# Patient Record
Sex: Female | Born: 2000
Health system: Southern US, Community
[De-identification: ages and names within clinical notes are randomized; demographics above are authoritative.]

## PROBLEM LIST (undated history)

## (undated) DIAGNOSIS — F329 Major depressive disorder, single episode, unspecified: Secondary | ICD-10-CM

## (undated) DIAGNOSIS — N390 Urinary tract infection, site not specified: Secondary | ICD-10-CM

## (undated) DIAGNOSIS — I499 Cardiac arrhythmia, unspecified: Secondary | ICD-10-CM

## (undated) DIAGNOSIS — R7303 Prediabetes: Secondary | ICD-10-CM

## (undated) DIAGNOSIS — F32A Depression, unspecified: Secondary | ICD-10-CM

## (undated) DIAGNOSIS — F419 Anxiety disorder, unspecified: Secondary | ICD-10-CM

## (undated) DIAGNOSIS — F909 Attention-deficit hyperactivity disorder, unspecified type: Secondary | ICD-10-CM

---
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
## 2020-02-01 ENCOUNTER — Ambulatory Visit (INDEPENDENT_AMBULATORY_CARE_PROVIDER_SITE_OTHER): Payer: 59 | Admitting: "Endocrinology

## 2020-02-01 ENCOUNTER — Encounter (INDEPENDENT_AMBULATORY_CARE_PROVIDER_SITE_OTHER): Payer: Self-pay | Admitting: "Endocrinology

## 2020-02-01 ENCOUNTER — Other Ambulatory Visit: Payer: Self-pay

## 2020-02-01 VITALS — BP 118/76 | HR 88 | Wt 192.6 lb

## 2020-02-01 DIAGNOSIS — Z6830 Body mass index (BMI) 30.0-30.9, adult: Secondary | ICD-10-CM

## 2020-02-01 DIAGNOSIS — F32A Depression, unspecified: Secondary | ICD-10-CM

## 2020-02-01 DIAGNOSIS — F419 Anxiety disorder, unspecified: Secondary | ICD-10-CM | POA: Diagnosis not present

## 2020-02-01 DIAGNOSIS — E049 Nontoxic goiter, unspecified: Secondary | ICD-10-CM | POA: Diagnosis not present

## 2020-02-01 DIAGNOSIS — E6609 Other obesity due to excess calories: Secondary | ICD-10-CM

## 2020-02-01 DIAGNOSIS — E063 Autoimmune thyroiditis: Secondary | ICD-10-CM

## 2020-02-01 NOTE — Patient Instructions (Signed)
Follow up visit in 4 months. Please call your psychiatrist today.

## 2020-02-01 NOTE — Progress Notes (Addendum)
Subjective:  Subjective  Patient Name: Dana Thompson Date of Birth: [birth year]  MRN: [id number]  Miki Roma Kayser) Dana Thompson  presents to the office today for follow up evaluation and management of her pre-diabetes and goiter.  HISTORY OF PRESENT ILLNESS:   Dana Thompson is a 19 y.o. Caucasian young lady.   Yelena was unaccompanied.  1. Layloni's initial pediatric endocrine evaluation occurred on 05/19/14.   A. Perinatal history: Gestational Age: [redacted]w[redacted]d; 5 lb 7 oz (2.466 kg); Healthy newborn, but had meconium staining so she was on antibiotics for 7 days.   B. Infancy: Healthy  C. Childhood: ADHD, for which she took Concerta; anxiety attacks for which she took fluoxetine; several prior strep infections, and possible chicken pox; No surgeries; No allergies to medications; She had some seasonal allergy symptoms and signs each Spring.  D. Chief complaint:   1).  Mom and [name] had discussed diabetes in the past when mom was in nursing school. [name] requested that a HbA1c value be drawn in August 2015. Mom reported that the value was 5.7%, c/w pre-diabetes. Follow up non-fasting lab tests on 12/06/13 showed a HbA1c of 5.9%, again c/w prediabetes, glucose of 85, cholesterol 145, triglycerides 54, HDL 55, and LDL 79.   2). [name] had been at camp for one month prior to the August tests and had been very active, After camp, however, she was fairly sedentary. After the September tests Dana Thompson was referred to Dr. [name] [name], RD, PhD, at the [facility] for nutritional counseling. Lifestyle changes had included reducing her intake of carbs and increasing her physical activity with volleyball and basketball.   E. Pertinent family history:   1). Obesity: Dad was overweight/obese. Mom gained about 40 pounds in the past year. Two paternal uncles were overweight.    2). DM: Maternal great grandmother developed DM at the end of her life.    3). Thyroid disease: None   4). ASCVD: Maternal grandfather had some form of heart disease.  Maternal grandfather also had a stroke due to fungal meningitis acquired in [place].    5). Cancers:  Paternal grandfather had throat CA. Mom's second cousin had brain cancer. Paternal grandmother had breast CA. Paternal grandaunt died of leukemia.   6). Others: Mom was recently diagnosed with fibromyalgia. Other members of her family had fibromyalgia.   F. Lifestyle:   1). Family diet: When Ashna was with mom they ate at restaurants about once a month. When she was with dad they ate at  restaurants about once a week.    2). Physical activities: None  2. Clinical course:  A. During the past 5 years, Dana Thompson has lost weight and gained weight several times, but the overall trend has been upward.  B. Her HbA1c values and her TFTs have remained normal.   3. [name]'s last [facility] visit occurred on 10/29/18.  I had asked her to return for a follow up visit in 4 months, but she did not.    A. In the interim she has been fairly healthy.   B. She had two covid vaccinations in April and May 2021.   C. 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 She also has some random pains all over.  D. She had her umbilicus pierced and worries that it may be infected.   E. She has had episodes of thyroid swelling and mild tenderness, but no severe episodes.  F. She now  takes Rexulti, Effexor, Vyvanse. She also has a Nexplanon implant.   G. Today is the first appointment in her trying to get herself together medically. She wants recommendations for a PCP.    H. She loves eating the free food at the [employer] she works at. She binge eats a lot., but no longer purges. She likes the way she looks now, but does not want to get any heavier.    4. Pertinent Review of Systems:  Constitutional: Dana Thompson feels "Okay - 4/10". Her energy level is "pretty low". "I can make it through the day at work, but I'm pretty tired at the end of the  10-hour day."  Eyes: Vision is okay, but she does need to see an eye doctor.   Neck: As above.   Heart: She has not been bothered by palpitations. She has a past history of PACs and has worn a Holter monitor before. Heart rate increases with exercise or other physical activity. She has no other complaints of irregular heart beats, chest pain, or chest pressure.   Gastrointestinal: She has not had any significant stomach bloating after meals. She has no complaints of excessive belly hunger, reflux, upset stomach, or epigastric pains. Bowel movents seem normal. The patient has no complaints of diarrhea or constipation.  Hands: No tremors Legs: Shin splints as above. There are no complaints of numbness, tingling, burning, or pain. No edema is noted.  Feet: She has swelling and pains at the end of the day. There are no complaints of numbness, tingling, or burning. She has calluses that she files down.  Neurologic: There are no recognized problems with muscle movement and strength, sensation, or coordination. GYN: Menarche occurred just before her 2th birthday. Her LMP was a week ago. She has a Nexplanon implant.   Psych: She is "not so great". She is often anxious, depressed, and sometimes has fleeting thoughts of suicide. She is not planning to commit suicide. She has not expressed these thoughts to her psychiatrist.   PAST MEDICAL, FAMILY, AND SOCIAL HISTORY  Past Medical History:  Diagnosis Date  . ADHD (attention deficit hyperactivity disorder)   . Anxiety   . Attention deficit disorder   . Depression   . Irregular heartbeat    Premature Atrial Contractions/ Palpitations  . Urinary tract infection    in past, but not currently    Family History  Problem Relation Age of Onset  . Cancer Maternal Grandmother   . Cancer Paternal Grandmother   . Hyperlipidemia Father   . Hypertension Father      Current Outpatient Medications:  .  REXULTI 1 MG TABS tablet, Take 1 mg by mouth daily.,  Disp: , Rfl:  .  venlafaxine XR (EFFEXOR-XR) 150 MG 24 hr capsule, TAKE 1 CAPSULE BY MOUTH EVERY MORNING WITH FOOD, Disp: , Rfl:  .  VYVANSE 50 MG capsule, Take 50 mg by mouth at bedtime., Disp: , Rfl:  .  desvenlafaxine (PRISTIQ) 50 MG 24 hr tablet, Take 1 tablet (50 mg total) by mouth daily. (Patient not taking: Reported on 02/13/2018), Disp: 30 tablet, Rfl: 0 .  drospirenone-ethinyl estradiol ([name]) 3-0.02 MG tablet, Take 1 tablet by mouth daily. (Patient not taking: Reported on 02/01/2020), Disp: 56 tablet, Rfl: 0 .  hydrOXYzine (ATARAX/VISTARIL) 25 MG tablet, Take 3 tablets (75 mg total) by mouth at bedtime. (Patient not taking: Reported on 02/01/2020), Disp: 30 tablet, Rfl: 0 .  hydrOXYzine (VISTARIL) 50 MG capsule, Take by mouth. (Patient not taking: Reported on  02/01/2020), Disp: , Rfl:  .  methylphenidate (METADATE CD) 60 MG CR capsule, Take 60 mg by mouth See admin instructions. Take one capsule (60 mg) by mouth daily on school days (Patient not taking: Reported on 02/01/2020), Disp: , Rfl:  .  methylphenidate (RITALIN) 10 MG tablet, Take 10 mg by mouth daily as needed (to focus for school work).  (Patient not taking: Reported on 02/01/2020), Disp: , Rfl:  .  methylphenidate (RITALIN) 10 MG tablet, Take 1 tablet (10 mg total) by mouth daily as needed (to focus for school work). (Patient not taking: Reported on 02/13/2018), Disp: 30 tablet, Rfl: 0 .  methylphenidate (RITALIN) 20 MG tablet, , Disp: , Rfl:  .  VIIBRYD 20 MG TABS, TAKE 1 TABLET BY MOUTH EVERY EVENING WITH FOOD (Patient not taking: Reported on 02/01/2020), Disp: , Rfl: 2  Allergies as of 02/01/2020  . (No Known Allergies)     reports that she has never smoked. She has never used smokeless tobacco. She reports current drug use. Drug: Marijuana. She reports that she does not drink alcohol. Pediatric History  Patient Parents  . Shaneka, Efaw (Mother)  . Maina,Alex (Father)   Other Topics Concern  . 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     1. 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 Parents are divorced. Akyah lives with her mother now.  2. Activities: She does a lot of walking as a Child psychotherapist.  3. Primary Care Provider: None 4. Psychiatrist: Dr. Ellamae Sia, phone [phone] 6. Therapist: None 7. Health insurance: She has health insurance through her father.   REVIEW OF SYSTEMS: There are no other significant problems involving Kolbi's other body systems.    Objective:  Objective  Vital Signs:  BP 118/76   Pulse 88   Wt 192 lb 9.6 oz (87.4 kg)   LMP 01/25/2020   BMI 30.59 kg/m     Ht Readings from Last 3 Encounters:  10/29/18 5' 6.54" (1.69 m) (82 %, Z= 0.91)*  02/13/18 5' 6.22" (1.682 m) (79 %, Z= 0.80)*  11/06/17 5' 7.09" (1.704 m) (88 %, Z= 1.15)*   * Growth percentiles are based on CDC (Girls, 2-20 Years) data.   Wt Readings from Last 3 Encounters:  02/01/20 192 lb 9.6 oz (87.4 kg) (97 %, Z= 1.83)*  10/29/18 156 lb (70.8 kg) (88 %, Z= 1.16)*  02/13/18 143 lb 3.2 oz (65 kg) (80 %, Z= 0.84)*   * Growth percentiles are based on CDC (Girls, 2-20 Years) data.   HC Readings from Last 3 Encounters:  No data found for [facility]   Body surface area is 2.03 meters squared. No height on file for this encounter. 97 %ile (Z= 1.83) based on CDC (Girls, 2-20 Years) weight-for-age data using vitals from 02/01/2020.    PHYSICAL EXAM:  Constitutional: Mickala appears healthy, but overweight/obese. Her height percentile has plateaued at about the 80%. Her weight has increased 36 pounds to the 96.67%. Her BMI has increased to about 30.5 kg/m2. Mariea was alert and bright. Her flow of speech was normal. Her affect and insight seemed quite normal. She does not  Appear suicidal at all.  Head: The head is normocephalic. Face: The face  appears normal. There are no obvious dysmorphic features. Eyes: The  eyes appear to be normally formed and spaced. Gaze is conjugate. There is no obvious arcus or proptosis. Moisture appears normal. Ears: The ears are normally placed and appear externally normal. Mouth: The oropharynx and tongue appear normal. Dentition appears to be normal for age. Oral moisture is normal. There is no mucosal hyperpigmentation. She has no tongue tremor.  Neck: The neck appears to be visibly full today. No carotid bruits are noted. The thyroid gland is mildly enlarged at about 21 grams in size. Both lobes are mildly enlarged today. The thyroid gland is tender to palpation bilaterally today. She has no acanthosis. Lungs: The lungs are clear to auscultation. Air movement is good. Heart: Heart rate and rhythm are regular. Heart sounds S1 and S2 are normal. I did not appreciate any pathologic cardiac murmurs. Abdomen: The abdomen is larger and soft. smaller. Bowel sounds are normal. There is no obvious hepatomegaly, splenomegaly, or other mass effect. She has an abdominal stud. The site is not tender or infected, but she has some reddish, thickened tissue at the right superior insertion margin that is c/w prior inflammation.  Arms: Muscle size and bulk are normal for age. Hands: She has no tremor today. Phalangeal and metacarpophalangeal joints are normal. Palmar muscles are normal for age. Palmar skin is normal. Palmar moisture is also normal. There is no palmar hyperpigmentation.  Legs: Muscles appear normal for age. No edema is present. She has some tenderness along her shins.  Feet: She has a few, mild calluses. DP pulses are 1+. Neurologic: Strength is normal for age in both the upper and lower extremities. Muscle tone is normal. Sensation to touch is normal in both legs and in both feet. Marland Kitchen.    LAB DATA:   No results found for this or any previous visit (from the past 672 hour(s)).   Labs 10/30/19: TSH 0.70, free T4  1.2, free T3 3.2  Labs 10/29/18: HbA1c 5.3%, CBG 131  Labs 02/13/18: HbA1c 5.4%, CBG 96; TSH 0.62, free T4 1.1, free T3 3.1, TPO antibody 1, thyroglobulin antibody <1  Labs 11/06/17:  HbA1c 5.4%; TSH 0.64, free T4 1.1, free T3 2.8  Labs 08/15/17: HbA1c 5.5%, CBG 114  Labs  05/01/17: Cholesterol 187, triglycerides 82, HDL 70, LDL 101  Labs 02/04/17: HbA1c 5.6%; TSH 1.796; cholesterol 192, triglycerides 67, HDL 66, LDL 113  Labs 02/01/17: CBC normal, CMP normal  Labs 07/26/16: HbA1c 5.4%  Labs 01/20/16: TSH 0.71, free T4 1.1, free T3 2.9  Labs 12/12/15: HbA1c 5.3%; TSH 1.70, free T4 1.0, free T3 3.0, TPO 4 (ref <9)   Labs 07/11/15: HbA1c 5.3%  Labs 07/05/15: TSH 2.21, free T4 0.9 (normal 0.80-1.40), free T3 2.1 (normal 3.0-4.7)   Labs 01/10/15: HbA1c 5.3%.  Labs 09/07/14: HbA1c 5.4%.   Labs 05/19/14: HbA1c 5.4%; TSH 1.336, free T4 0.91, free T3 3.2, anti-thyroglobulin antibody <1 (normal < 2), TPO antibody 2 (normal < 9); C-peptide 1.05    Assessment and Plan:  Assessment  ASSESSMENT:  1-4. Elevated HbA1c/History of pre-diabetes/impaired glucose tolerance/overweight:   A. Thedora's HbA1c values in August and September 2015 were definitely in the prediabetes range. At those times her BMI was at or slightly above the 85%, c/w being overweight.   B. When she was relatively overweight, the amount of cytokines produced by her "overly fat" adipose cells was excessive, she had more insulin resistance, and compensatory hyperinsulinemia. Despite the hyperinsulinemia, however, her HbA1c increased into the prediabetes range.   C. After making some changes in  what she ate and how much she ate, and playing volleyball, her weight had decreased to the 76.3% and her HbA1c had decreased to the 5.3% range in October 2016. Thereafter her weight and HbA1c fluctuated.    D. At her visit in May 2019 she had lost more weight, but her HbA1c had increased again. At her August 2019 visit she had re-gained some  weight, but her HbA1c was lower. At her visit in August 2020, her weight has increased, but her HbA1c was lower.   E. At today's visit in November 2021, her weight has increased into the obesity zone. I will order an  HbA1c test to be done.  5-6. Goiter/thyroiditis:   A. Jahzara has had waxing and waning of her thyroid gland size over time, c/w evolving Hashimoto's thyroiditis. She has also had episodic thyroid gland tenderness, also c/w evolving Hashimoto's thyroiditis. She has had more minor flare ups since her last visit.   B. Although there is no known family history of thyroid disease, the incidence and prevalence of Hashimoto's Dz is increasing not only in families, but in sporadic cases. If mom's fibromyalgia is indeed an autoimmune disease, as many experts believe, then the presence of autoimmunity is in the family genetics. At Kanchan's age, Hashimoto's thyroiditis is the most frequent cause of goiter.  C. Calen's antibody tests for Hashimoto's disease have remained negative. The negative antibodies do not, however, rule out Hashimoto's Dz, since Hashimoto's Dz is a T-lymphocyte-mediated phenomenon and antibodies are produced by B cells. These two groups of lymphocytes often do not act in synchrony.  D. Theressa's TFTs have remained within normal limits, but she has had fluctuations of the TFTs that were c/w episodic thyroiditis.    E. Aniqua's thyroiditis was clinically quiescent at her visit in August 2020, but is clinically active today in November 2020. She is clinically hypothyroid.   7. Acanthosis nigricans: This condition has resolved. 8. Palpitations: Resolved   9. Fatigue: Her fatigue is much worse 10. Depression: She looks fairly good today, but subjectively is not doing as well as she was doing a month ago. She has had some "fleeting" suicidal thoughts in the recent past, but is not actively planning to commit suicide. I asked her to contact her psychiatrist today to pass on that information. She  agreed. 11. Tremor: Resolved. 12. Feet pains: I suggested that she get a good pair of shoes at the [employer].   PLAN:  1. Diagnostic: HbA1c, TFTs, CMP, thyroid antibodies today.  2. Therapeutic: Eat Right Diet, [facility] Diet as a source of menus to operationalize the Eat Right Diet 3. Patient education: We discussed the issues of overweight, glucose intolerance, goiter, and thyroiditis. We again discussed the likelihood that she will become permanently hypothyroid. We also discussed the adverse effects of obesity.   4. Follow-up: 4 months    Level of Service: This visit lasted in excess of 50 minutes. More than 50% of the visit was devoted to counseling the family   Molli Knock, MD, CDE Pediatric and Adult Endocrinology

## 2020-02-02 LAB — COMPREHENSIVE METABOLIC PANEL
AG Ratio: 1.6 (calc) (ref 1.0–2.5)
ALT: 12 U/L (ref 5–32)
AST: 21 U/L (ref 12–32)
Albumin: 4.5 g/dL (ref 3.6–5.1)
Alkaline phosphatase (APISO): 95 U/L (ref 36–128)
BUN: 13 mg/dL (ref 7–20)
CO2: 27 mmol/L (ref 20–32)
Calcium: 9.6 mg/dL (ref 8.9–10.4)
Chloride: 102 mmol/L (ref 98–110)
Creat: 0.82 mg/dL (ref 0.50–1.00)
Globulin: 2.9 g/dL (calc) (ref 2.0–3.8)
Glucose, Bld: 92 mg/dL (ref 65–99)
Potassium: 4.7 mmol/L (ref 3.8–5.1)
Sodium: 137 mmol/L (ref 135–146)
Total Bilirubin: 0.3 mg/dL (ref 0.2–1.1)
Total Protein: 7.4 g/dL (ref 6.3–8.2)

## 2020-02-02 LAB — HEMOGLOBIN A1C
Hgb A1c MFr Bld: 5.6 % of total Hgb (ref <5.7)
Mean Plasma Glucose: 114 (calc)
eAG (mmol/L): 6.3 (calc)

## 2020-02-02 LAB — THYROGLOBULIN ANTIBODY: Thyroglobulin Ab: <1 IU/mL (ref <=1)

## 2020-02-02 LAB — T4, FREE: Free T4: 1.1 ng/dL (ref 0.8–1.4)

## 2020-02-02 LAB — THYROID PEROXIDASE ANTIBODY: Thyroperoxidase Ab SerPl-aCnc: 2 IU/mL (ref <9)

## 2020-02-02 LAB — TSH: TSH: 0.87 mIU/L

## 2020-02-02 LAB — T3, FREE: T3, Free: 3 pg/mL (ref 3.0–4.7)

## 2020-02-09 ENCOUNTER — Encounter (INDEPENDENT_AMBULATORY_CARE_PROVIDER_SITE_OTHER): Payer: Self-pay

## 2020-04-27 ENCOUNTER — Emergency Department (EMERGENCY_DEPARTMENT_HOSPITAL)
Admission: EM | Admit: 2020-04-27 | Discharge: 2020-04-28 | Payer: 59 | Source: Home / Self Care | Attending: Emergency Medicine | Admitting: Emergency Medicine

## 2020-04-27 ENCOUNTER — Other Ambulatory Visit: Payer: Self-pay

## 2020-04-27 ENCOUNTER — Encounter (HOSPITAL_COMMUNITY): Payer: Self-pay

## 2020-04-27 DIAGNOSIS — F418 Other specified anxiety disorders: Secondary | ICD-10-CM | POA: Insufficient documentation

## 2020-04-27 DIAGNOSIS — F909 Attention-deficit hyperactivity disorder, unspecified type: Secondary | ICD-10-CM | POA: Insufficient documentation

## 2020-04-27 DIAGNOSIS — Z20822 Contact with and (suspected) exposure to covid-19: Secondary | ICD-10-CM | POA: Insufficient documentation

## 2020-04-27 DIAGNOSIS — R45851 Suicidal ideations: Secondary | ICD-10-CM

## 2020-04-27 LAB — COMPREHENSIVE METABOLIC PANEL
ALT: 17 U/L (ref 0–44)
AST: 19 U/L (ref 15–41)
Albumin: 4.5 g/dL (ref 3.5–5.0)
Alkaline Phosphatase: 81 U/L (ref 38–126)
Anion gap: 11 (ref 5–15)
BUN: 12 mg/dL (ref 6–20)
CO2: 20 mmol/L — ABNORMAL LOW (ref 22–32)
Calcium: 9.5 mg/dL (ref 8.9–10.3)
Chloride: 104 mmol/L (ref 98–111)
Creatinine, Ser: 0.75 mg/dL (ref 0.44–1.00)
GFR, Estimated: >60 mL/min (ref >60)
Glucose, Bld: 93 mg/dL (ref 70–99)
Potassium: 4 mmol/L (ref 3.5–5.1)
Sodium: 135 mmol/L (ref 135–145)
Total Bilirubin: 0.6 mg/dL (ref 0.3–1.2)
Total Protein: 7.9 g/dL (ref 6.5–8.1)

## 2020-04-27 LAB — CBC
HCT: 44 % (ref 36.0–46.0)
Hemoglobin: 14.5 g/dL (ref 12.0–15.0)
MCH: 28.9 pg (ref 26.0–34.0)
MCHC: 33 g/dL (ref 30.0–36.0)
MCV: 87.6 fL (ref 80.0–100.0)
Platelets: 313 10*3/uL (ref 150–400)
RBC: 5.02 MIL/uL (ref 3.87–5.11)
RDW: 13.4 % (ref 11.5–15.5)
WBC: 9.7 10*3/uL (ref 4.0–10.5)
nRBC: 0 % (ref 0.0–0.2)

## 2020-04-27 LAB — I-STAT BETA HCG BLOOD, ED (MC, WL, AP ONLY): I-stat hCG, quantitative: <5 m[IU]/mL (ref <5)

## 2020-04-27 LAB — SARS CORONAVIRUS 2 BY RT PCR (HOSPITAL ORDER, PERFORMED IN ~~LOC~~ HOSPITAL LAB): SARS Coronavirus 2: NEGATIVE

## 2020-04-27 LAB — SALICYLATE LEVEL: Salicylate Lvl: <7 mg/dL — ABNORMAL LOW (ref 7.0–30.0)

## 2020-04-27 LAB — ETHANOL: Alcohol, Ethyl (B): <10 mg/dL (ref <10)

## 2020-04-27 LAB — ACETAMINOPHEN LEVEL: Acetaminophen (Tylenol), Serum: <10 ug/mL — ABNORMAL LOW (ref 10–30)

## 2020-04-27 MED ORDER — LORAZEPAM 1 MG PO TABS
2.0000 mg | ORAL_TABLET | Freq: Once | ORAL | Status: AC
  Administered 2020-04-27: 2 mg via ORAL
  Filled 2020-04-27: qty 2

## 2020-04-27 NOTE — ED Provider Notes (Signed)
[facility]-EMERGENCY DEPT Provider Note   CSN: [id number] Arrival date & time: 04/27/20  2039     History Chief Complaint  Patient presents with  . Suicidal    Dana Thompson is a 20 y.o. female.  Patient presents ER chief complaint of increased anxiety depression and thoughts of self-harm.  History provided by father and patient.  Patient herself just keep screaming out I want to die so badly.  Will not elaborate much more than that is very tearful and difficult to get review of systems or additional history.  Father states that she has been taking her regular meds but has been more more agitated for the past 2 days.        Past Medical History:  Diagnosis Date  . ADHD (attention deficit hyperactivity disorder)   . Anxiety   . Attention deficit disorder   . Depression   . Irregular heartbeat    Premature Atrial Contractions/ Palpitations  . Urinary tract infection    in past, but not currently    Patient Active Problem List   Diagnosis Date Noted  . MDD (major depressive disorder), recurrent episode, severe (HCC) 04/29/2017  . Suicide ideation   . MDD (major depressive disorder), recurrent severe, without psychosis (HCC) 02/02/2017  . Postprandial abdominal bloating 07/27/2016  . Thyroiditis, autoimmune 01/10/2015  . Acquired acanthosis nigricans 01/10/2015  . Palpitations 01/10/2015  . Prediabetes 06/07/2014  . Childhood overweight, BMI 85-94.9 percentile 06/07/2014  . Goiter 06/07/2014    History reviewed. No pertinent surgical history.   OB History   No obstetric history on file.     Family History  Problem Relation Age of Onset  . Cancer Maternal Grandmother   . Cancer Paternal Grandmother   . Hyperlipidemia Father   . Hypertension Father     Social History   Tobacco Use  . Smoking status: Never Smoker  . Smokeless tobacco: Never Used  Substance Use Topics  . Alcohol use: No    Alcohol/week: 0.0 standard drinks    Comment:  Once a month.  . Drug use: Yes    Types: Marijuana    Comment: occasionally    Home Medications Prior to Admission medications   Medication Sig Start Date End Date Taking? Authorizing Provider  CAPLYTA 42 MG CAPS Take 1 capsule by mouth daily. 04/25/20  Yes [provider]  etonogestrel (NEXPLANON) 68 MG IMPL implant 1 each by Subdermal route once.   Yes [provider]  venlafaxine XR (EFFEXOR-XR) 150 MG 24 hr capsule Take 150 mg by mouth daily with breakfast. 09/01/18  Yes [provider]  venlafaxine XR (EFFEXOR-XR) 75 MG 24 hr capsule Take 75 mg by mouth daily. 03/24/20  Yes [provider]  VYVANSE 50 MG capsule Take 50 mg by mouth daily. 01/18/20  Yes [provider]    Allergies    Patient has no known allergies.  Review of Systems   Review of Systems  Unable to perform ROS: Psychiatric disorder    Physical Exam Updated Vital Signs BP 134/86 (BP Location: Left Arm)   Pulse 92   Temp 98.3 F (36.8 C) (Oral)   Resp 18   Ht 5\' 6"  (1.676 m)   Wt 80.3 kg   SpO2 95%   BMI 28.57 kg/m   Physical Exam Constitutional:      General: She is not in acute distress.    Appearance: Normal appearance.  HENT:     Head: Normocephalic.  Nose: Nose normal.  Eyes:     Extraocular Movements: Extraocular movements intact.  Cardiovascular:     Rate and Rhythm: Normal rate.  Pulmonary:     Effort: Pulmonary effort is normal.  Musculoskeletal:        General: Normal range of motion.     Cervical back: Normal range of motion.  Neurological:     General: No focal deficit present.     Mental Status: She is alert. Mental status is at baseline.  Psychiatric:     Comments: Crying, agitated     ED Results / Procedures / Treatments   Labs (all labs ordered are listed, but only abnormal results are displayed) Labs Reviewed  COMPREHENSIVE METABOLIC PANEL - Abnormal; Notable for the following components:      Result Value   CO2 20 (*)     All other components within normal limits  SALICYLATE LEVEL - Abnormal; Notable for the following components:   Salicylate Lvl <7.0 (*)    All other components within normal limits  ACETAMINOPHEN LEVEL - Abnormal; Notable for the following components:   Acetaminophen (Tylenol), Serum <10 (*)    All other components within normal limits  SARS CORONAVIRUS 2 BY RT PCR (HOSPITAL ORDER, PERFORMED IN [facility])  ETHANOL  CBC  RAPID URINE DRUG SCREEN, HOSP PERFORMED  I-STAT BETA HCG BLOOD, ED ([facility], [facility], [facility] ONLY)    EKG None  Radiology No results found.  Procedures Procedures   Medications Ordered in ED Medications  LORazepam (ATIVAN) tablet 2 mg (2 mg Oral Given 04/27/20 2202)    ED Course  I have reviewed the triage vital signs and the nursing notes.  Pertinent labs & imaging results that were available during my care of the patient were reviewed by me and considered in my medical decision making (see chart for details).    MDM Rules/Calculators/A&P                          Is unremarkable.  Patient medically cleared pending TTS evaluation. Final Clinical Impression(s) / ED Diagnoses Final diagnoses:  Suicidal ideation    Rx / DC Orders ED Discharge Orders    None       Cheryll Cockayne, MD 04/27/20 2205

## 2020-04-27 NOTE — ED Notes (Signed)
Pt visibly very upset and crying in the room at this time.

## 2020-04-27 NOTE — ED Triage Notes (Signed)
Pt comes in voluntarily by her dad, she states that she can't quit thinking about dying, she states she really doesn't want inpatient again Pt states that she has therapy once a week and feels better at first, pt wouldn't go into details with me and she is actively crying

## 2020-04-28 ENCOUNTER — Encounter (HOSPITAL_COMMUNITY): Payer: Self-pay | Admitting: Psychiatry

## 2020-04-28 ENCOUNTER — Inpatient Hospital Stay (HOSPITAL_COMMUNITY)
Admission: AD | Admit: 2020-04-28 | Discharge: 2020-05-03 | DRG: 885 | Disposition: A | Discharge status: Home / Self Care | Payer: 59 | Source: Ambulatory Visit | Attending: Psychiatry | Admitting: Psychiatry

## 2020-04-28 DIAGNOSIS — Z9151 Personal history of suicidal behavior: Secondary | ICD-10-CM | POA: Diagnosis not present

## 2020-04-28 DIAGNOSIS — R45851 Suicidal ideations: Secondary | ICD-10-CM

## 2020-04-28 DIAGNOSIS — Z20822 Contact with and (suspected) exposure to covid-19: Secondary | ICD-10-CM | POA: Diagnosis present

## 2020-04-28 DIAGNOSIS — F909 Attention-deficit hyperactivity disorder, unspecified type: Secondary | ICD-10-CM | POA: Diagnosis present

## 2020-04-28 DIAGNOSIS — Z818 Family history of other mental and behavioral disorders: Secondary | ICD-10-CM | POA: Diagnosis not present

## 2020-04-28 DIAGNOSIS — F332 Major depressive disorder, recurrent severe without psychotic features: Secondary | ICD-10-CM | POA: Diagnosis present

## 2020-04-28 DIAGNOSIS — R4587 Impulsiveness: Secondary | ICD-10-CM | POA: Diagnosis present

## 2020-04-28 DIAGNOSIS — F17290 Nicotine dependence, other tobacco product, uncomplicated: Secondary | ICD-10-CM | POA: Diagnosis present

## 2020-04-28 DIAGNOSIS — F339 Major depressive disorder, recurrent, unspecified: Secondary | ICD-10-CM | POA: Diagnosis present

## 2020-04-28 MED ORDER — NICOTINE 21 MG/24HR TD PT24
21.0000 mg | MEDICATED_PATCH | Freq: Once | TRANSDERMAL | Status: DC
  Filled 2020-04-28: qty 1

## 2020-04-28 MED ORDER — LORAZEPAM 1 MG PO TABS
2.0000 mg | ORAL_TABLET | Freq: Once | ORAL | Status: AC
  Administered 2020-04-28: 1 mg via ORAL

## 2020-04-28 MED ORDER — LORAZEPAM 2 MG/ML IJ SOLN
INTRAMUSCULAR | Status: AC
  Administered 2020-04-28: 2 mg
  Filled 2020-04-28: qty 1

## 2020-04-28 MED ORDER — LORAZEPAM 1 MG PO TABS
ORAL_TABLET | ORAL | Status: AC
  Filled 2020-04-28: qty 2

## 2020-04-28 MED ORDER — ZIPRASIDONE MESYLATE 20 MG IM SOLR: 20.0000 mg | INTRAMUSCULAR | Status: DC | PRN

## 2020-04-28 MED ORDER — LORAZEPAM 1 MG PO TABS
1.0000 mg | ORAL_TABLET | Freq: Four times a day (QID) | ORAL | Status: DC | PRN
  Filled 2020-04-28: qty 1

## 2020-04-28 MED ORDER — ACETAMINOPHEN 325 MG PO TABS: 650.0000 mg | ORAL_TABLET | Freq: Four times a day (QID) | ORAL | Status: DC | PRN

## 2020-04-28 MED ORDER — NICOTINE 21 MG/24HR TD PT24
MEDICATED_PATCH | TRANSDERMAL | Status: AC
  Filled 2020-04-28: qty 1

## 2020-04-28 MED ORDER — LORAZEPAM 2 MG/ML IJ SOLN: 2.0000 mg | Freq: Once | INTRAMUSCULAR | Status: DC

## 2020-04-28 MED ORDER — LORAZEPAM 1 MG PO TABS: 2.0000 mg | ORAL_TABLET | ORAL | Status: DC | PRN

## 2020-04-28 MED ORDER — OLANZAPINE 5 MG PO TBDP: 5.0000 mg | ORAL_TABLET | Freq: Three times a day (TID) | ORAL | Status: DC | PRN

## 2020-04-28 MED ORDER — NICOTINE 21 MG/24HR TD PT24
21.0000 mg | MEDICATED_PATCH | Freq: Every day | TRANSDERMAL | Status: DC
  Administered 2020-04-28 – 2020-05-03 (×6): 21 mg via TRANSDERMAL
  Filled 2020-04-28 (×8): qty 1

## 2020-04-28 MED ORDER — LORAZEPAM 1 MG PO TABS: 1.0000 mg | ORAL_TABLET | ORAL | Status: DC | PRN

## 2020-04-28 MED ORDER — ALUM & MAG HYDROXIDE-SIMETH 200-200-20 MG/5ML PO SUSP: 30.0000 mL | ORAL | Status: DC | PRN

## 2020-04-28 MED ORDER — LORAZEPAM 1 MG PO TABS: 2.0000 mg | ORAL_TABLET | Freq: Once | ORAL | Status: DC

## 2020-04-28 MED ORDER — OLANZAPINE 10 MG PO TBDP: 10.0000 mg | ORAL_TABLET | Freq: Three times a day (TID) | ORAL | Status: DC | PRN

## 2020-04-28 MED ORDER — LORAZEPAM 2 MG/ML IJ SOLN: 1.0000 mg | Freq: Four times a day (QID) | INTRAMUSCULAR | Status: DC | PRN

## 2020-04-28 MED ORDER — MAGNESIUM HYDROXIDE 400 MG/5ML PO SUSP
30.0000 mL | Freq: Every day | ORAL | Status: DC | PRN
Start: 2020-04-28 — End: 2020-05-03

## 2020-04-28 MED ORDER — HYDROXYZINE HCL 25 MG PO TABS
25.0000 mg | ORAL_TABLET | Freq: Four times a day (QID) | ORAL | Status: DC | PRN
  Administered 2020-04-28 – 2020-05-02 (×6): 25 mg via ORAL
  Filled 2020-04-28 (×6): qty 1

## 2020-04-28 MED ORDER — TRAZODONE HCL 50 MG PO TABS
50.0000 mg | ORAL_TABLET | Freq: Every evening | ORAL | Status: DC | PRN
  Administered 2020-04-29 – 2020-04-30 (×2): 50 mg via ORAL
  Filled 2020-04-28 (×4): qty 1

## 2020-04-28 NOTE — Consult Note (Signed)
Telepsych Consultation   Reason for Consult:  with a plan Referring Physician:  EPD Location of Patient:  Location of Provider: [facility]  Patient Identification: Dana Thompson MRN:  [id number] Principal Diagnosis: <principal problem not specified> Diagnosis:  Active Problems:   * No active hospital problems. *   Total Time spent with patient: 15 minutes  Subjective:   Dana Thompson is a 20 y.o. female was reevaluated for possible discharge. Patient continues to endorse suicidal  ideations. Patient denied plan or intent during this assessment. "  I do not know what to do" reported multiple inpatient admissions.  Reports a follow-up appointment with therapist today.  was reported patient's father is requesting discharge and is able to keep patient safe.  Per TTS counselor patient admits to suicidal plans to shoot himself with a gun.  We will continue to recommend inpatient admission.  Patient to keep follow-up appointment with attending psychiatrist MD Betti Cruz and therapist after discharge.  HPI: per admission assessment note: Dana Thompson is a 20 years old female who presents voluntarily to [facility]  and accompanied by her father Dana Thompson [phone].  Pt provided TTS Counselor permission to contact and speak with her father, unable to contact; also, was unable to leave a Hippa message.  Pt reports that she has a history of depression and has been feeling increasingly sad, crying, isolating, irritable, guilty and feeling hopelessness. Pt reports that she has lost seventeen pounds in one week.  Pt reports her sleep has been on and off, not consisted.  Pt denies any recent manic symptoms.  Pt reports that she has been feeling suicidal and her plan would involve utilizing a gun "making it quit".  Pt reported that she have had three previous attempts of suicidal, did not want to discuss previous plans and means.  Pt denies any history of intentional self injurious behaviors.  Pt denies any  homicidal ideation history of violence.  Pt denies history of auditory or visual hallucinations.  Pt reports that she drinks alcohol occassions and uses marijuana daily.  Pt reports that she vapes daily  Past Psychiatric History:  Risk to Self:   Risk to Others:   Prior Inpatient Therapy:   Prior Outpatient Therapy:    Past Medical History:  Past Medical History:  Diagnosis Date  . ADHD (attention deficit hyperactivity disorder)   . Anxiety   . Attention deficit disorder   . Depression   . Irregular heartbeat    Premature Atrial Contractions/ Palpitations  . Urinary tract infection    in past, but not currently   History reviewed. No pertinent surgical history. Family History:  Family History  Problem Relation Age of Onset  . Cancer Maternal Grandmother   . Cancer Paternal Grandmother   . Hyperlipidemia Father   . Hypertension Father    Family Psychiatric  History:  Social History:  Social History   Substance and Sexual Activity  Alcohol Use No  . Alcohol/week: 0.0 standard drinks   Comment: Once a month.     Social History   Substance and Sexual Activity  Drug Use Yes  . Types: Marijuana   Comment: occasionally    Social History   Socioeconomic History  . Marital status: Single    Spouse name: Not on file  . Number of children: Not on file  . Years of education: Not on file  . Highest education level: Not on file  Occupational History  . Not on file  Tobacco Use  . Smoking  status: Never Smoker  . Smokeless tobacco: Never Used  Substance and Sexual Activity  . Alcohol use: No    Alcohol/week: 0.0 standard drinks    Comment: Once a month.  . Drug use: Yes    Types: Marijuana    Comment: occasionally  . Sexual activity: Yes    Birth control/protection: Pill  Other Topics Concern  . 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    Social Determinants of Health    Financial Resource Strain: Not on file  Food Insecurity: Not on file  Transportation Needs: Not on file  Physical Activity: Not on file  Stress: Not on file  Social Connections: Not on file   Additional Social History:    Allergies:  No Known Allergies  Labs:  Results for orders placed or performed during the hospital encounter of 04/27/20 (from the past 48 hour(s))  Comprehensive metabolic panel     Status: Abnormal   Collection Time: 04/27/20  9:13 PM  Result Value Ref Range   Sodium 135 135 - 145 mmol/L   Potassium 4.0 3.5 - 5.1 mmol/L   Chloride 104 98 - 111 mmol/L   CO2 20 (L) 22 - 32 mmol/L   Glucose, Bld 93 70 - 99 mg/dL    Comment: Glucose reference range applies only to samples taken after fasting for at least 8 hours.   BUN 12 6 - 20 mg/dL   Creatinine, Ser 6.06 0.44 - 1.00 mg/dL   Calcium 9.5 8.9 - 30.1 mg/dL   Total Protein 7.9 6.5 - 8.1 g/dL   Albumin 4.5 3.5 - 5.0 g/dL   AST 19 15 - 41 U/L   ALT 17 0 - 44 U/L   Alkaline Phosphatase 81 38 - 126 U/L   Total Bilirubin 0.6 0.3 - 1.2 mg/dL   GFR, Estimated >60 >10 mL/min    Comment: (NOTE) Calculated using the CKD-EPI Creatinine Equation (2021)    Anion gap 11 5 - 15    Comment: Performed at [facility], [address], [place], [place] [address]  Ethanol     Status: None   Collection Time: 04/27/20  9:13 PM  Result Value Ref Range   Alcohol, Ethyl (B) <10 <10 mg/dL    Comment: (NOTE) Lowest detectable limit for serum alcohol is 10 mg/dL.  For medical purposes only. Performed at [facility], [address], [place], [place] [address]   Salicylate level     Status: Abnormal   Collection Time: 04/27/20  9:13 PM  Result Value Ref Range   Salicylate Lvl <7.0 (L) 7.0 - 30.0 mg/dL    Comment: Performed at [facility], [address] [place], [place] [address]  Acetaminophen level     Status: Abnormal   Collection Time: 04/27/20  9:13 PM  Result  Value Ref Range   Acetaminophen (Tylenol), Serum <10 (L) 10 - 30 ug/mL    Comment: (NOTE) Therapeutic concentrations vary significantly. A range of 10-30 ug/mL  may be an effective concentration for many patients. However, some  are best treated at concentrations outside of this range. Acetaminophen concentrations >150 ug/mL at 4 hours after ingestion  and >50 ug/mL at 12 hours after ingestion are often associated with  toxic reactions.  Performed at [facility], [address] [place], [place] [address]   cbc  Status: None   Collection Time: 04/27/20  9:13 PM  Result Value Ref Range   WBC 9.7 4.0 - 10.5 K/uL   RBC 5.02 3.87 - 5.11 MIL/uL   Hemoglobin 14.5 12.0 - 15.0 g/dL   HCT 62.1 30.8 - 65.7 %   MCV 87.6 80.0 - 100.0 fL   MCH 28.9 26.0 - 34.0 pg   MCHC 33.0 30.0 - 36.0 g/dL   RDW 84.6 96.2 - 95.2 %   Platelets 313 150 - 400 K/uL   nRBC 0.0 0.0 - 0.2 %    Comment: Performed at [facility], [address], [place], [place] [address]  I-Stat beta hCG blood, ED     Status: None   Collection Time: 04/27/20  9:16 PM  Result Value Ref Range   I-stat hCG, quantitative <5.0 <5 mIU/mL   Comment 3            Comment:   GEST. AGE      CONC.  (mIU/mL)   <=1 WEEK        5 - 50     2 WEEKS       50 - 500     3 WEEKS       100 - 10,000     4 WEEKS     1,000 - 30,000        FEMALE AND NON-PREGNANT FEMALE:     LESS THAN 5 mIU/mL   SARS Coronavirus 2 by RT PCR (hospital order, performed in [facility] hospital lab) Nasopharyngeal Nasopharyngeal Swab     Status: None   Collection Time: 04/27/20  9:52 PM   Specimen: Nasopharyngeal Swab  Result Value Ref Range   SARS Coronavirus 2 NEGATIVE NEGATIVE    Comment: (NOTE) SARS-CoV-2 target nucleic acids are NOT DETECTED.  The SARS-CoV-2 RNA is generally detectable in upper and lower respiratory specimens during the acute phase of infection. The lowest concentration of SARS-CoV-2 viral copies this  assay can detect is 250 copies / mL. A negative result does not preclude SARS-CoV-2 infection and should not be used as the sole basis for treatment or other patient management decisions.  A negative result may occur with improper specimen collection / handling, submission of specimen other than nasopharyngeal swab, presence of viral mutation(s) within the areas targeted by this assay, and inadequate number of viral copies (<250 copies / mL). A negative result must be combined with clinical observations, patient history, and epidemiological information.  Fact Sheet for Patients:   BoilerBrush.com.cy  Fact Sheet for Healthcare Providers: https://pope.com/  This test is not yet approved or  cleared by the [place] FDA and has been authorized for detection and/or diagnosis of SARS-CoV-2 by FDA under an Emergency Use Authorization (EUA).  This EUA will remain in effect (meaning this test can be used) for the duration of the COVID-19 declaration under Section 564(b)(1) of the Act, 21 U.S.C. section 360bbb-3(b)(1), unless the authorization is terminated or revoked sooner.  Performed at [facility], [address], [place], [place] [address]     Medications:  No current facility-administered medications for this encounter.   Current Outpatient Medications  Medication Sig Dispense Refill  . CAPLYTA 42 MG CAPS Take 1 capsule by mouth daily.    Marland Kitchen etonogestrel (NEXPLANON) 68 MG IMPL implant 1 each by Subdermal route once.    . venlafaxine XR (EFFEXOR-XR) 150 MG 24 hr capsule Take 150 mg by mouth daily with breakfast.    .  venlafaxine XR (EFFEXOR-XR) 75 MG 24 hr capsule Take 75 mg by mouth daily.    Marland Kitchen VYVANSE 50 MG capsule Take 50 mg by mouth daily.      Musculoskeletal: Strength & Muscle Tone: within normal limits Gait & Station: normal Patient leans: N/A  Psychiatric Specialty Exam: Physical Exam Vitals  reviewed.  Psychiatric:        Mood and Affect: Mood normal.        Thought Content: Thought content normal.     Review of Systems  Psychiatric/Behavioral: Positive for behavioral problems and suicidal ideas. The patient is nervous/anxious.   All other systems reviewed and are negative.   Blood pressure (!) 102/52, pulse 68, temperature 98.3 F (36.8 C), temperature source Oral, resp. rate 18, height 5\' 6"  (1.676 m), weight 80.3 kg, SpO2 96 %.Body mass index is 28.57 kg/m.  General Appearance: Casual  Eye Contact:  Good  Speech:  Clear and Coherent  Volume:  Normal  Mood:  Anxious and Depressed  Affect:  Depressed  Thought Process:  Coherent  Orientation:  Full (Time, Place, and Person)  Thought Content:  Logical  Suicidal Thoughts:  Yes.  without intent/plan  Homicidal Thoughts:  No  Memory:  Immediate;   Fair Recent;   Fair  Judgement:  Fair  Insight:  Fair  Psychomotor Activity:  Normal  Concentration:  Concentration: Fair  Recall:  Fair  Fund of Knowledge:  Good  Language:  Fair  Akathisia:  No  Handed:  Right  AIMS (if indicated):     Assets:  Communication Skills Desire for Improvement Resilience Social Support  ADL's:  Intact  Cognition:  WNL  Sleep:        Treatment Plan Summary: Daily contact with patient to assess and evaluate symptoms and progress in treatment and Medication management  Disposition: Recommend psychiatric Inpatient admission when medically cleared.  Patient accepted to [facility] 302.1  This service was provided via telemedicine using a 2-way, interactive audio and video technology.  Names of all persons participating in this telemedicine service and their role in this encounter. Name: Kaena Mapa  Role: patient   Name: T.Kamren Heintzelman  Role: NP           Oneta Rack, NP 04/28/2020 10:40 AM

## 2020-04-28 NOTE — Progress Notes (Signed)
Psychoeducational Group Note  Date:  04/28/2020 Time: 2015 Group Topic/Focus:  wrap up group  Participation Level: Did Not Attend  Participation Quality:  Not Applicable  Affect:  Not Applicable  Cognitive:  Not Applicable  Insight:  Not Applicable  Engagement in Group: Not Applicable  Additional Comments: Pt remained in bed during group time.  Marcille Buffy 04/28/2020, 8:32 PM

## 2020-04-28 NOTE — Progress Notes (Signed)
Pt transferred from [facility] to [facility].  Upon pt arrival, pt was crying loudly, sobbing, asking for her "blankee" which she called her blanket and asking for mom and dad.  Pt did not want to come on the unit and said she felt she was well enough to go home.  Pt is irritable, labile and sobbing during assessment.  This RN was unable to get much information because of pt's state of mind and mood when pt arrived at [facility].  Pt was IVC'd at [facility].    Pt required 2 mg IM injection of ativan when pt first got in her room.  Pt slept for several hours post injection and is now up and spending time in her room.

## 2020-04-28 NOTE — ED Notes (Addendum)
Call from TTS pt to be re-evaluated in the morning

## 2020-04-28 NOTE — BH Assessment (Signed)
TTS spoke with Nurse Secretary to put pt in private room to complete TTS assessment.  She will contact nurse and call when she is ready.

## 2020-04-28 NOTE — ED Provider Notes (Signed)
Emergency Medicine Observation Re-evaluation Note  Dana Thompson is a 20 y.o. female, seen on rounds today.  Pt initially presented to the ED for complaints of Suicidal Currently, the patient is inpatient psych placement.  Physical Exam  BP (!) 102/52 (BP Location: Left Arm)   Pulse 68   Temp 98.3 F (36.8 C) (Oral)   Resp 18   Ht 5\' 6"  (1.676 m)   Wt 80.3 kg   SpO2 96%   BMI 28.57 kg/m  Physical Exam General: Calm and cooperative Cardiac: well perfused Lungs: even, unlabored respirations Psych: Calm  ED Course / MDM  EKG:    I have reviewed the labs performed to date as well as medications administered while in observation.  Recent changes in the last 24 hours include patient accepted to inpatient psych with plan for transfer later this AM.  10:55 AM  Patient accepted to [facility]. Ready for admit after 11 AM.   Plan  Current plan is for placement in inpatient bed later this AM.   Dylan Ruotolo, [facility], MD 04/28/20 1056

## 2020-04-28 NOTE — ED Notes (Addendum)
Alex, dad-please call with care plan-[phone]-father states she has an appointment with her psychiatrist today-would prefer she keep appointment-states inpatient hospitalization has not helped daughter in the past-states he thinks recent episode was behaviorial

## 2020-04-28 NOTE — ED Notes (Signed)
Patient's father visiting with patient. Patient was informed that she was IVC'd and had a bed at [facility]. Patient crying and sobbing loudly that she wants to go home. Father remains at the bedside.

## 2020-04-28 NOTE — BH Assessment (Addendum)
[facility] Assessment Progress Note  Notes from overnight report that pt has been accepted to [facility] 305-2.  [name], Walta [place], is reportedly upset about this and notes that pt has an appointment with an outpatient therapist today.  Per the request of charge nurse Kennyth Arnold, this writer spoke with pt and father in person.  During discussion pt divulges SI with plan to shoot herself or overdose.  She also mumbled another response that this writer did not understand and pt did not repeat it.  The father then responds by saying that he cannot keep pt safe at home.  He notes that while there are no guns in the household, he cannot assure that she would not access to medications for an overdose.  He now favors hospitalization.  These details were staffed with Hillery Jacks, NP, who finds that pt continues to require psychiatric hospitalization.  Pt presents under IVC initiated by EDP Norman Clay, MD, and these documents have been faxed to [facility].  Danika, RN, [facility] reports that she will contact me shortly with a time to transport pt.  She is to be transported by [facility] police.  EDP Alona Bene, MD and pt's nurse, Silvio Pate, have been notifed.  Doylene Canning, [place] [facility] Coordinator [phone]   Addendum:  Per Lorin Glass, [facility] will be ready to receive pt at 11:00.  Doylene Canning, [place] [facility] Coordinator [phone]

## 2020-04-28 NOTE — ED Notes (Signed)
[facility] called for transport to [facility].

## 2020-04-28 NOTE — BH Assessment (Signed)
Comprehensive Clinical Assessment (CCA) Note  04/28/2020 Dana Thompson [id number]  Chief Complaint:  F33.9 Major depressive disorder, Recurrent episode, Unspecified Chief Complaint  Patient presents with  . Suicidal   Visit Diagnosis:  F33.9 Major depressive disorder, Recurrent episode, Unspecified   Dana Thompson is a 20 years old female who presents voluntarily to [facility]  and accompanied by her father Dana Thompson [phone].  Pt provided TTS Counselor permission to contact and speak with her father, unable to contact; also, was unable to leave a Hippa message.  Pt reports that she has a history of depression and has been feeling increasingly sad, crying, isolating, irritable, guilty and feeling hopelessness. Pt reports that she has lost seventeen pounds in one week.  Pt reports her sleep has been on and off, not consisted.  Pt denies any recent manic symptoms.  Pt reports that she has been feeling suicidal and her plan would involve utilizing a gun "making it quit".  Pt reported that she have had three previous attempts of suicidal, did not want to discuss previous plans and means.  Pt denies any history of intentional self injurious behaviors.  Pt denies any homicidal ideation history of violence.  Pt denies history of auditory or visual hallucinations.  Pt reports that she drinks alcohol occassions and uses marijuana daily.  Pt reports that she vapes daily.  Pt was unable to identify her stressors "I am tired of being sad".  Pt reported that she attended [facility] [employer] [facility] for one year, decide to return home.  Pt lives with both parents, also, identify her parents as her primary support.  Pt reports history of mental illness with her family.  Pt denies any history of abuse or trauma.  Pt denies any current legal problems.  Pt says he is currently receiving weekly outpatient therapy with Dana Thompson and is receiving outpatient medication management with Dr. Betti Thompson.  Pt is dressed  casual attire, holding and messaging a blanket.  Pt is oriented x 4 with clear and coherent speech.  Pt has Thompson motor behavior.  Eye contact is avoided and pt mood is worthless and affect is depressed.  Thought process is coherent and relevant.  Pt's insight is poor and judgement is fair.  There is no indication Pt is currently responding to internal stimuli or experiencing delusional thought content.  Pt was suspicious throughout assessment.  Pt reports that she has no guns.            Disposition: Dana Thompson is accepting this patient, Attending is Dana Thompson. This patient is accepted to bed 305-2 pending discharges this morning. The morning AC will coordinate. Please pre-admit this patient. Disposition discussed with Dana Thompson CN, via secure chat in Epic.   CCA Screening, Triage and Referral (STR)  Patient Reported Information How did you hear about [place]? Family/Friend  Referral name: No data recorded Referral phone number: [phone]   Whom do you see for routine medical problems? Primary Care; Hospital ER  Practice/Facility Name: [facility]  Practice/Facility Phone Number: No data recorded Name of Contact: UTA  Contact Number: UTA  Contact Fax Number: UTA  Prescriber Name: UTA  Prescriber Address (if known): UTA   What Is the Reason for Your Visit/Call Today? SI, Depression  How Long Has This Been Causing You Problems? 1 wk - 1 month  What Do You Feel Would Help You the Most Today? -- (UTA)   Have You Recently Been in Any Inpatient Treatment (Hospital/Detox/Crisis Center/28-Day Program)? No  Name/Location of Program/Hospital:No data  recorded How Long Were You There? No data recorded When Were You Discharged? No data recorded  Have You Ever Received Services From [facility] Before? Yes  Who Do You See at [facility]? 02/01/20   Have You Recently Had Any Thoughts About Hurting Yourself? Yes  Are You Planning to Commit Suicide/Harm Yourself At This time?  No   Have you Recently Had Thoughts About Hurting Someone Dana Thompson? No  Explanation: No data recorded  Have You Used Any Alcohol or Drugs in the Past 24 Hours? Yes  How Long Ago Did You Use Drugs or Alcohol? 2300  What Did You Use and How Much? Marijuana   Do You Currently Have a Therapist/Psychiatrist? Yes  Name of Therapist/Psychiatrist: Victorino Dike Thompson, Dr. Betti Thompson   Have You Been Recently Discharged From Any Office Practice or Programs? No  Explanation of Discharge From Practice/Program: No data recorded    CCA Screening Triage Referral Assessment Type of Contact: Tele-Assessment  Is this Initial or Reassessment? Initial Assessment  Date Telepsych consult ordered in [facility]:  04/28/2020  Time Telepsych consult ordered in [facility]:  No data recorded  Patient Reported Information Reviewed? Yes  Patient Left Without Being Seen? No data recorded Reason for Not Completing Assessment: No data recorded  Collateral Involvement: Pt gave permission to speak with Dana Thompson (father) [phone]   Does Patient Have a Court Appointed Legal Guardian? No data recorded Name and Contact of Legal Guardian: No data recorded If Minor and Not Living with Parent(s), Who has Custody? n/a  Is CPS involved or ever been involved? Never  Is APS involved or ever been involved? Never   Patient Determined To Be At Risk for Harm To Self or Others Based on Review of Patient Reported Information or Presenting Complaint? Yes, for Self-Harm  Method: No data recorded Availability of Means: No data recorded Intent: No data recorded Notification Required: No data recorded Additional Information for Danger to Others Potential: No data recorded Additional Comments for Danger to Others Potential: No data recorded Are There Guns or Other Weapons in Your Home? No data recorded Types of Guns/Weapons: No data recorded Are These Weapons Safely Secured?                            No data recorded Who Could  Verify You Are Able To Have These Secured: No data recorded Do You Have any Outstanding Charges, Pending Court Dates, Parole/Probation? No data recorded Contacted To Inform of Risk of Harm To Self or Others: -- (Pt parents notified)   Location of Assessment: [facility]   Does Patient Present under Involuntary Commitment? No  IVC Papers Initial File Date: No data recorded  [place] of Residence: [place]   Patient Currently Receiving the Following Services: Individual Therapy   Determination of Need: No data recorded  Options For Referral: -- (UTA)     CCA Biopsychosocial Intake/Chief Complaint:  SI, depression  Current Symptoms/Problems: crying, irritable, isolating, feeling guilty, worthlessnes, and hopelessness   Patient Reported Schizophrenia/Schizoaffective Diagnosis in Past: No   Strengths: UTA  Preferences: UTA  Abilities: Putting Legos together   Type of Services Patient Feels are Needed: UTA   Initial Clinical Notes/Concerns: SI, weight lost   Mental Health Symptoms Depression:  Change in energy/activity; Difficulty Concentrating; Fatigue; Hopelessness; Irritability; Tearfulness; Weight gain/loss; Worthlessness; Sleep (too much or little) (Pt reports that she has lost 17lbs in one week.)   Duration of Depressive symptoms: Greater than two weeks  Mania:  None   Anxiety:   Difficulty concentrating; Fatigue; Irritability; Restlessness; Sleep; Tension; Worrying   Psychosis:  None   Duration of Psychotic symptoms: No data recorded  Trauma:  Difficulty staying/falling asleep; Emotional numbing; Guilt/shame   Obsessions:  Recurrent & persistent thoughts/impulses/images   Compulsions:  Intended to reduce stress or prevent another outcome   Inattention:  None   Hyperactivity/Impulsivity:  N/A   Oppositional/Defiant Behaviors:  No data recorded  Emotional Irregularity:  Chronic feelings of emptiness; Potentially harmful impulsivity; Recurrent suicidal  behaviors/gestures/threats; Unstable self-image   Other Mood/Personality Symptoms:  Avoidant    Mental Status Exam Appearance and self-care  Stature:  Average   Weight:  Average weight   Clothing:  Disheveled   Grooming:  Normal   Cosmetic use:  Inappropriate for age   Posture/gait:  Normal   Motor activity:  Thompson   Sensorium  Attention:  Distractible; Inattentive   Concentration:  Normal   Orientation:  Object; Person; Place; Situation   Recall/memory:  Normal   Affect and Mood  Affect:  Depressed   Mood:  Worthless   Relating  Eye contact:  Avoided   Facial expression:  No data recorded  Attitude toward examiner:  Cooperative   Thought and Language  Speech flow: Clear and Coherent   Thought content:  Suspicious   Preoccupation:  Guilt; Suicide   Hallucinations:  None   Organization:  No data recorded  [employer] of Knowledge:  Average   Intelligence:  Average   Abstraction:  -- (UTA)   Judgement:  Fair   Reality Testing:  Distorted   Insight:  Poor   Decision Making:  Paralyzed   Social Functioning  Social Maturity:  Isolates   Social Judgement:  Victimized   Stress  Stressors:  Relationship (Pt reports that she had an encounter with coworker felt uncomfortable being around him.)   Coping Ability:  Overwhelmed   Skill Deficits:  Decision making; Responsibility; Self-control   Supports:  Family; Friends/Service system     Religion: Religion/Spirituality Are You A Religious Person?:  (UTA) How Might This Affect Treatment?: UTA  Leisure/Recreation: Leisure / Recreation Do You Have Hobbies?: Yes Leisure and Hobbies: Clinical biochemist together  Exercise/Diet: Exercise/Diet Do You Exercise?:  (UTA) Have You Gained or Lost A Significant Amount of Weight in the Past Six Months?: Yes-Lost Number of Pounds Lost?: 17 (Pt reports that she lost 17lbs in a week) Do You Follow a Special Diet?:  (Pt reports that she is  not trying to lose weight, no special diet.) Do You Have Any Trouble Sleeping?:  (Pt reports her sleep is on and off, have recieved eight hours and sometimes have received three hours during the night.)   CCA Employment/Education Employment/Work Situation: Employment / Work Situation Employment situation: Unemployed Patient's job has been impacted by current illness: No What is the longest time patient has a held a job?: UTA Where was the patient employed at that time?: UTA Has patient ever been in the [employer]?: No  Education: Education Is Patient Currently Attending School?: No Last Grade Completed: 12 Name of High School: [employer] Did Garment/textile technologist From [employer]?: Yes Did Theme park manager?: Yes What Type of College Degree Do you Have?: Pt reports that she attended one semester at [employer] Did [place] Attend Graduate School?: No What Was Your Major?: UTA Did You Have Any Special Interests In School?: UTA Did You Have An Individualized Education Program (IIEP):  (UTA) Did You Have  Any Difficulty At School?:  (UTA) Patient's Education Has Been Impacted by Current Illness:  (UTA)   CCA Family/Childhood History Family and Relationship History: Family history Are you sexually active?:  (UTA) What is your sexual orientation?: UTA Has your sexual activity been affected by drugs, alcohol, medication, or emotional stress?: UTA Does patient have children?: No  Childhood History:  Childhood History By whom was/is the patient raised?: Both parents Additional childhood history information: UTA Description of patient's relationship with caregiver when they were a child: Supportive Patient's description of current relationship with people who raised him/her: caring How were you disciplined when you got in trouble as a child/adolescent?: UTA Does patient have siblings?: No Did patient suffer any verbal/emotional/physical/sexual abuse as a child?: No Did patient suffer from  severe childhood neglect?: No Has patient ever been sexually abused/assaulted/raped as an adolescent or adult?: No Was the patient ever a victim of a crime or a disaster?: No Witnessed domestic violence?: No Has patient been affected by domestic violence as an adult?: No  Child/Adolescent Assessment:     CCA Substance Use Alcohol/Drug Use: Alcohol / Drug Use Pain Medications: See MRA Prescriptions: See MRA Over the Counter: See MRA History of alcohol / drug use?: Yes Substance #1 Name of Substance 1: Alcohol 1 - Age of First Use: 18 1 - Amount (size/oz): UTA 1 - Frequency: Occassionly 1 - Duration: UTA 1 - Last Use / Amount: UTA Substance #2 Name of Substance 2: Marijuana 2 - Age of First Use: 18 2 - Amount (size/oz): 10 grams 2 - Frequency: daily 2 - Duration: ongoing 2 - Last Use / Amount: 04/27/20 Substance #3 Name of Substance 3: Vapor 3 - Age of First Use: 18 3 - Amount (size/oz): UTA 3 - Frequency: daily 3 - Duration: ongoing 3 - Last Use / Amount: 04/28/20                   ASAM's:  Six Dimensions of Multidimensional Assessment  Dimension 1:  Acute Intoxication and/or Withdrawal Potential:      Dimension 2:  Biomedical Conditions and Complications:      Dimension 3:  Emotional, Behavioral, or Cognitive Conditions and Complications:     Dimension 4:  Readiness to Change:     Dimension 5:  Relapse, Continued use, or Continued Problem Potential:     Dimension 6:  Recovery/Living Environment:     ASAM Severity Score:    ASAM Recommended Level of Treatment:     Substance use Disorder (SUD)    Recommendations for Services/Supports/Treatments:    DSM5 Diagnoses: Patient Active Problem List   Diagnosis Date Noted  . MDD (major depressive disorder), recurrent episode, severe (HCC) 04/29/2017  . Suicide ideation   . MDD (major depressive disorder), recurrent severe, without psychosis (HCC) 02/02/2017  . Postprandial abdominal bloating 07/27/2016   . Thyroiditis, autoimmune 01/10/2015  . Acquired acanthosis nigricans 01/10/2015  . Palpitations 01/10/2015  . Prediabetes 06/07/2014  . Childhood overweight, BMI 85-94.9 percentile 06/07/2014  . Goiter 06/07/2014      Referrals to Alternative Service(s): Referred to Alternative Service(s):   Place:   Date:   Time:    Referred to Alternative Service(s):   Place:   Date:   Time:    Referred to Alternative Service(s):   Place:   Date:   Time:    Referred to Alternative Service(s):   Place:   Date:   Time:     Meryle Ready, Counselor

## 2020-04-29 DIAGNOSIS — F332 Major depressive disorder, recurrent severe without psychotic features: Principal | ICD-10-CM

## 2020-04-29 MED ORDER — DIPHENHYDRAMINE HCL 50 MG PO CAPS
50.0000 mg | ORAL_CAPSULE | Freq: Once | ORAL | Status: AC
  Administered 2020-04-29: 50 mg via ORAL
  Filled 2020-04-29: qty 2
  Filled 2020-04-29: qty 1

## 2020-04-29 MED ORDER — LORAZEPAM 1 MG PO TABS: 1.0000 mg | ORAL_TABLET | ORAL | Status: DC | PRN

## 2020-04-29 MED ORDER — HALOPERIDOL LACTATE 5 MG/ML IJ SOLN
5.0000 mg | Freq: Once | INTRAMUSCULAR | Status: AC
  Filled 2020-04-29: qty 1

## 2020-04-29 MED ORDER — OLANZAPINE 5 MG PO TBDP
5.0000 mg | ORAL_TABLET | Freq: Three times a day (TID) | ORAL | Status: DC | PRN
Start: 2020-04-29 — End: 2020-05-03
  Administered 2020-05-01: 5 mg via ORAL
  Filled 2020-04-29: qty 1

## 2020-04-29 MED ORDER — HALOPERIDOL 5 MG PO TABS
5.0000 mg | ORAL_TABLET | Freq: Once | ORAL | Status: AC
  Administered 2020-04-29: 5 mg via ORAL
  Filled 2020-04-29 (×2): qty 1

## 2020-04-29 MED ORDER — VENLAFAXINE HCL ER 75 MG PO CP24
225.0000 mg | ORAL_CAPSULE | Freq: Every day | ORAL | Status: DC
  Administered 2020-04-29 – 2020-05-03 (×5): 225 mg via ORAL
  Filled 2020-04-29 (×7): qty 3

## 2020-04-29 MED ORDER — ZIPRASIDONE MESYLATE 20 MG IM SOLR: 20.0000 mg | INTRAMUSCULAR | Status: DC | PRN

## 2020-04-29 NOTE — [facility] Suicide Risk Assessment (Signed)
[facility] INPATIENT:  Family/Significant Other Suicide Prevention Education  Suicide Prevention Education:  Education Completed; Lolamae Voisin [phone] (Father)  has been identified by the patient as the family member/significant other with whom the patient will be residing, and identified as the person(s) who will aid the patient in the event of a mental health crisis (suicidal ideations/suicide attempt).  With written consent from the patient, the family member/significant other has been provided the following suicide prevention education, prior to the and/or following the discharge of the patient.  The suicide prevention education provided includes the following:  Suicide risk factors  Suicide prevention and interventions  National Suicide Hotline telephone number  [facility] assessment telephone number  [facility] Emergency Assistance 911  [facility] and/or Residential Mobile Crisis Unit telephone number  Request made of family/significant other to:  Remove weapons (e.g., guns, rifles, knives), all items previously/currently identified as safety concern.    Remove drugs/medications (over-the-counter, prescriptions, illicit drugs), all items previously/currently identified as a safety concern.  The family member/significant other verbalizes understanding of the suicide prevention education information provided.  The family member/significant other agrees to remove the items of safety concern listed above.  CSW spoke with Mr. Ostenson who states that his daughter's medications were recently changed and he believes that his daughter sounds more anxious since being changed to [place].  Mr. Tourangeau states that this is the first adult hospitalization his daughter has been to and that she has been to [facility] 2 or 3 times on the child and adolescent side.  Mr. Amrhein states that when his daughter gets anxious she states to him that she feels tingly inside and says that everything  has a sharp point to it.  Mr. Dante states that his daughters current psychiatrist states that she may have Bipolar 2 and the father would like his daughter to try Bipolar medications.  Mr. Almon states that he has been anxious for his daughter and wants her to be safe and learn some coping skills.  Mr. Duling states there are no firearms or weapons in the home.  CSW completed SPE with Mr. Theilen.   Metro Kung Eliabeth Shoff 04/29/2020, 11:39 AM
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
## 2020-04-29 NOTE — Progress Notes (Signed)
Patient was crying and yelling. Patient took her medicine with 1 mg ativan. Patient is in room reading a book. This writer is sitting with patient.
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
## 2020-04-29 NOTE — Progress Notes (Signed)
   04/29/20 2215  COVID-19 Daily Checkoff  Have you had a fever (temp > 37.80C/100F)  in the past 24 hours?  No  If you have had runny nose, nasal congestion, sneezing in the past 24 hours, has it worsened? No  COVID-19 EXPOSURE  Have you traveled outside the state in the past 14 days? No  Have you been in contact with someone with a confirmed diagnosis of COVID-19 or PUI in the past 14 days without wearing appropriate PPE? No  Have you been living in the same home as a person with confirmed diagnosis of COVID-19 or a PUI (household contact)? No  Have you been diagnosed with COVID-19? No

## 2020-04-29 NOTE — [facility] Counselor (Signed)
CSW spoke with Dana Thompson and allowed her to borrow a book from the social work department called "Me Before You".  Dana Thompson thanked CSW for the book and states that she wants to go home and does not believe this hospitalization will help her.  Dana Thompson was tearful but spoke with this CSW and that helped her stop crying.  Dana Thompson appears to feel better when there is a person who she can connect and talk with about how she feels.  CSW will continue to monitor.  Dana Thompson may exchange the book for another one in the social work office but will need to ask another CSW to retreive the book from the office for her.

## 2020-04-29 NOTE — Progress Notes (Signed)
Patient is currently awake. She came to nursing station to request water and received a snack also. Writer walked to her room and spoke with her while she ate her snack. She was polite. Writer informed her of medication for anxiety and sleep available if needed and she requested both. She reported that she would read some before going to sleep. Bed linen given for her bed. 1:1 continues and patient is safe.

## 2020-04-29 NOTE — Progress Notes (Addendum)
Patient came out of room around 4:30am and presented very tearful and states she is upset because she is bored and there is nothing to do in her room. Patient expresses she feels she should not be here and states she wants her parents and her "blankey" and stated the RN was not being understanding of her after RN explained that it was bedtime. Patient stated she wanted to be at [facility] and not [facility]. Patient states that being here makes her want to die. Patient then states "I know I shouldn't be saying that but it's true."  Patient requested to use the telephone several times and became agitated when RN explained phone times. Patient requested Ativan and RN asked patient to return to room until wake up time and RN will reassess patient and decide if Ativan was needed. Patient was given paper and pencil as requested. Patient proceeded to go to room and scream and cry until RN came into the room. Patient states she was trying to calm down but is unable to. RN informed patient she was being disruptive to the other patients during bedtime.

## 2020-04-29 NOTE — Tx Team (Signed)
Interdisciplinary Treatment and Diagnostic Plan Update  04/29/2020 Time of Session: 9:15am  [name] MRN: [id number]  Principal Diagnosis: <principal problem not specified>  Secondary Diagnoses: Active Problems:   Severe recurrent major depression (HCC)   Major depressive disorder, recurrent episode ([place])   Current Medications:  Current Facility-Administered Medications  Medication Dose Route Frequency Provider Last Rate Last Admin  . acetaminophen (TYLENOL) tablet 650 mg  650 mg Oral Q6H PRN Lindon Romp A, NP      . alum & mag hydroxide-simeth (MAALOX/MYLANTA) 200-200-20 MG/5ML suspension 30 mL  30 mL Oral Q4H PRN Lindon Romp A, NP      . hydrOXYzine (ATARAX/VISTARIL) tablet 25 mg  25 mg Oral Q6H PRN Lindell Spar I, NP   25 mg at 04/28/20 1706  . LORazepam (ATIVAN) tablet 1 mg  1 mg Oral Q6H PRN Briant Cedar, MD       Or  . LORazepam (ATIVAN) injection 1 mg  1 mg Intramuscular Q6H PRN Briant Cedar, MD      . magnesium hydroxide (MILK OF MAGNESIA) suspension 30 mL  30 mL Oral Daily PRN Lindon Romp A, NP      . nicotine (NICODERM CQ - dosed in mg/24 hours) patch 21 mg  21 mg Transdermal Daily Harlow Asa, MD   21 mg at 04/29/20 0936  . traZODone (DESYREL) tablet 50 mg  50 mg Oral QHS PRN Lindell Spar I, NP       PTA Medications: Medications Prior to Admission  Medication Sig Dispense Refill Last Dose  . CAPLYTA 42 MG CAPS Take 1 capsule by mouth daily.     Marland Kitchen etonogestrel (NEXPLANON) 68 MG IMPL implant 1 each by Subdermal route once.     . venlafaxine XR (EFFEXOR-XR) 150 MG 24 hr capsule Take 150 mg by mouth daily with breakfast.     . venlafaxine XR (EFFEXOR-XR) 75 MG 24 hr capsule Take 75 mg by mouth daily.     Marland Kitchen VYVANSE 50 MG capsule Take 50 mg by mouth daily.       Patient Stressors:    Patient Strengths:    Treatment Modalities: Medication Management, Group therapy, Case management,  1 to 1 session with clinician, Psychoeducation, Recreational  therapy.   Physician Treatment Plan for Primary Diagnosis: <principal problem not specified> Long Term Goal(s):     Short Term Goals:    Medication Management: Evaluate patient's response, side effects, and tolerance of medication regimen.  Therapeutic Interventions: 1 to 1 sessions, Unit Group sessions and Medication administration.  Evaluation of Outcomes: Not Met  Physician Treatment Plan for Secondary Diagnosis: Active Problems:   Severe recurrent major depression ([place])   Major depressive disorder, recurrent episode ([place])  Long Term Goal(s):     Short Term Goals:       Medication Management: Evaluate patient's response, side effects, and tolerance of medication regimen.  Therapeutic Interventions: 1 to 1 sessions, Unit Group sessions and Medication administration.  Evaluation of Outcomes: Not Met   RN Treatment Plan for Primary Diagnosis: <principal problem not specified> Long Term Goal(s): Knowledge of disease and therapeutic regimen to maintain health will improve  Short Term Goals: Ability to remain free from injury will improve, Ability to demonstrate self-control, Ability to participate in decision making will improve, Ability to verbalize feelings will improve, Ability to disclose and discuss suicidal ideas and Ability to identify and develop effective coping behaviors will improve  Medication Management: RN will administer medications as ordered by provider, will  assess and evaluate patient's response and provide education to patient for prescribed medication. RN will report any adverse and/or side effects to prescribing provider.  Therapeutic Interventions: 1 on 1 counseling sessions, Psychoeducation, Medication administration, Evaluate responses to treatment, Monitor vital signs and CBGs as ordered, Perform/monitor CIWA, COWS, AIMS and Fall Risk screenings as ordered, Perform wound care treatments as ordered.  Evaluation of Outcomes: Not Met   LCSW Treatment Plan  for Primary Diagnosis: <principal problem not specified> Long Term Goal(s): Safe transition to appropriate next level of care at discharge, Engage patient in therapeutic group addressing interpersonal concerns.  Short Term Goals: Engage patient in aftercare planning with referrals and resources, Increase ability to appropriately verbalize feelings, Increase emotional regulation, Facilitate acceptance of mental health diagnosis and concerns and Identify triggers associated with mental health/substance abuse issues  Therapeutic Interventions: Assess for all discharge needs, 1 to 1 time with Social worker, Explore available resources and support systems, Assess for adequacy in community support network, Educate family and significant other(s) on suicide prevention, Complete Psychosocial Assessment, Interpersonal group therapy.  Evaluation of Outcomes: Not Met   Progress in Treatment: Attending groups: No. Participating in groups: No. Taking medication as prescribed: Yes. Toleration medication: Yes. Family/Significant other contact made: Yes, individual(s) contacted:  Mother and Father Patient understands diagnosis: No. Discussing patient identified problems/goals with staff: Yes. Medical problems stabilized or resolved: Yes. Denies suicidal/homicidal ideation: Yes. Issues/concerns per patient self-inventory: No.   New problem(s) identified: No, Describe:  None  New Short Term/Long Term Goal(s): medication stabilization, elimination of SI thoughts, development of comprehensive mental wellness plan.   Patient Goals:  "To go home"  Discharge Plan or Barriers: Patient recently admitted. CSW will continue to follow and assess for appropriate referrals and possible discharge planning.   Reason for Continuation of Hospitalization: Anxiety Medication stabilization Suicidal ideation  Estimated Length of Stay: 3 to 5 days   Attendees: Patient: Dana Thompson 04/29/2020   Physician: Lala Lund, MD 04/29/2020   Nursing:  04/29/2020   RN Care Manager: 04/29/2020   Social Worker: Verdis Frederickson, LCSWA 04/29/2020   Recreational Therapist:  04/29/2020   Other:  04/29/2020   Other:  04/29/2020   Other: 04/29/2020     Scribe for Treatment Team: Darleen Crocker, [place] 04/29/2020 11:05 AM
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
## 2020-04-29 NOTE — Progress Notes (Signed)
Recreation Therapy Notes  Date: 2.4.22 Time: 0930 Location: [address] Hall Dayroom  Group Topic: Stress Management  Goal Area(s) Addresses:  Patient will identify positive stress management techniques. Patient will identify benefits of using stress management post d/c.  Intervention: Stress Management  Activity: Meditation.  LRT played a meditation that focused on making the most of your day and looking at each day as a blank page to create new memories or accomplish new goals.    Education:  Stress Management, Discharge Planning.   Education Outcome: Acknowledges Education  Clinical Observations/Feedback: Pt did not attend group activity.   Caroll Rancher, LRT/CTRS    Lillia Abed, Lorelee Mclaurin A 04/29/2020 11:11 AM

## 2020-04-29 NOTE — Progress Notes (Signed)
Pt observed with verbal intermittent verbal outburst X 3 this evening. Threatening to self harm with gestures to tie her sweat shirt and blanket around her neck, stating "you guys are lying to my father. Give me my blankie now".   Verbal redirections was ineffective at the time. Pt offered PRN Ativan earlier this shift while yelling on the phone with family "I don't want that. I want to go home". Pt receive Haldol 59m g PO and Benadryl 50 mg PO as ordered at 1617. All belongings taken away from pt due to self harm gestures and pt was dressed in scrub for safety. 1:1 initiated at 1642 due to escalating behavioral outburst "I'm not dressing in this shit, I don't feel comfortable in this" and proceed to lay in the hallway refusing to get up en route to 302. Support, encouragement and reassurance offered to pt throughout this shift. All medications given as ordered.  Pt is tearful in room at present with 1:1 staff in attendance. Pt did not attend groups this shift. Remains safe on unit.

## 2020-04-29 NOTE — Progress Notes (Signed)
1:1 Nursing note - Patient is currently lying in bed asleep. Writer has checked on her 2-3 times and she has been lying in bed asleep. Sitter did report that she has been awake the first time Clinical research associate came in but she was observed lying in bed with her eyes closed and did not answer nor open her eyes. 1:1 continues with sitter at bedside.

## 2020-04-29 NOTE — [facility] Group Notes (Signed)
[facility] LCSW Group Therapy  04/29/2020 2:19 PM  Type of Therapy:  Group Therapy:Why you should go to therapy Ted Talk/ Challenging Negative thoughts  Participation Level:  Did Not Attend  Felizardo Hoffmann 04/29/2020, 2:19 PM

## 2020-04-29 NOTE — Progress Notes (Signed)
D: Spoke with patient briefly upon awakening. Patient presents with anxious affect. Patient denies SI/HI at this time. Patient also denies AH/VH at this time. Patient contracts for safety.  A: Provided positive reinforcement and encouragement.  R: Patient cooperative and receptive to efforts. Patient remains safe on the unit.   04/28/20 2128  Psych Admission Type (Psych Patients Only)  Admission Status Involuntary  Psychosocial Assessment  Patient Complaints None  Eye Contact Glaring  Facial Expression Anxious;Animated  Affect Depressed;Anxious  Speech Logical/coherent  Interaction Attention-seeking  Motor Activity Other (Comment) (Patient lying in bed upon assessment)  Appearance/Hygiene Unremarkable  Behavior Characteristics Anxious  Mood Anxious  Thought Process  Coherency Circumstantial  Content Blaming others  Delusions Somatic  Perception Derealization  Hallucination None reported or observed  Judgment Impaired  Confusion WDL  Danger to Self  Current suicidal ideation? Denies  Danger to Others  Danger to Others None reported or observed  Danger to Others Abnormal  Harmful Behavior to others No threats or harm toward other people

## 2020-04-29 NOTE — Progress Notes (Signed)
Pt awake in bed with assigned 1:1 MHT staff in attendance. Tore her scrubs into pieces on bedroom floor. Encouraged to clean it up which she did after being argumentative. Denies SI, HI, without gestures at this time. Safety maintained.

## 2020-04-30 NOTE — [facility] Group Notes (Signed)
LCSW Group Therapy Note  04/30/2020   10:00-11:00am   Topic:  Anger Triggers and Coping Skills  Participation Level:  Did Not Attend  Description of Group:   In this group, patients learned how to recognize the physical, cognitive, emotional, and behavioral responses they have to anger-provoking situations.  They identified their own common triggers and typical reactions then analyzed how these reactions are possibly beneficial and possibly unhelpful.  Focus was placed on how helpful it is to recognize the underlying emotions to anger in order to address these for more permanent resolution.  Emphasis was also on identifying possible replacement thoughts for the automatic thoughts generated in various situations shared by the group.  Therapeutic Goals: 1. Patients will share situations that commonly incite their anger and how they typically respond 2. Patients will identify how their coping skills work for them and/or against them 3. Patients will explore possible alternative thoughts to their automatic ones 4. Patients will learn that anger itself is normal and that healthier reactions can assist with resolving conflict rather than worsening situations  Summary of Patient Progress:  The patient was invited, opted not to attend group.  Therapeutic Modalities:   Cognitive Behavioral Therapy Processing  Lynnell Chad

## 2020-04-30 NOTE — Progress Notes (Signed)
   04/30/20 2255  Psych Admission Type (Psych Patients Only)  Admission Status Involuntary  Psychosocial Assessment  Patient Complaints Anxiety  Eye Contact Brief  Facial Expression Flat  Affect Appropriate to circumstance  Speech Logical/coherent  Interaction Assertive  Motor Activity Fidgety  Appearance/Hygiene Unremarkable  Behavior Characteristics Appropriate to situation  Mood Pleasant  Thought Process  Coherency Circumstantial  Content WDL;Blaming others  Delusions None reported or observed  Perception WDL  Hallucination None reported or observed  Judgment Impaired  Confusion None  Danger to Self  Current suicidal ideation? Denies  Danger to Others  Danger to Others None reported or observed  Danger to Others Abnormal  Harmful Behavior to others No threats or harm toward other people  Destructive Behavior No threats or harm toward property

## 2020-04-30 NOTE — [facility] Group Notes (Signed)
.  Psychoeducational Group Note  Date: 04-30-2020 Time: 0900-1000    Goal Setting   Purpose of Group: This group helps to provide patients with the steps of setting Thompson goal that is specific, measurable, attainable, realistic and time specific. Thompson discussion on how we keep ourselves stuck with negative self talk.    Participation Level:  Did not attend  Dana Thompson  

## 2020-04-30 NOTE — Progress Notes (Signed)
Patient denies SI/HI. She verbally contracts for safety on the unit. She reports ongoing depression 5/10 and anxiety 7/10 (10 worst). She denies AVH. She reports sleeping well at night. She reports having a lot of anxiety when she's around a lot of people and declined to attend groups.   Orders reviewed. Vital signs reviewed. Verbal support provided. 15 minute checks performed for safety.   Pt compliant with treatment plan and denies any medication side effects.

## 2020-04-30 NOTE — Progress Notes (Signed)
1:1 Nursing note- Patient lying in bed reading and sat up when writer entered to talk with her. Writer praised her for having a good night and working towards having a good day as well. She was informed that she does not have to stay in her room all day and can come to dayroom. She reported that she didn't want to see anybody else. She inquired about her personal belongings being returned today. Writer will report this to oncoming nurse. Support given and safety maintained with sitter at bedside.

## 2020-04-30 NOTE — [facility] Group Notes (Signed)
Patient did not attend AA group. 

## 2020-04-30 NOTE — Progress Notes (Signed)
Patient observed lying down in bed asleep with sitter present at the bedside. Patient respirations even and unlabored. No signs of distress noted.

## 2020-04-30 NOTE — Progress Notes (Signed)
1:1 Nursing note- ( late entry) Patient currently asleep, sitter reports that patient appears restless while sleeping, moving about in bed a lot. 1:1 continues with sitter at bedside. Will continue to monitor.

## 2020-04-30 NOTE — Progress Notes (Signed)
[facility] MD Progress Note  04/30/2020 7:33 AM Dana Thompson  MRN:  [id number] Subjective:  Dana Thompson is a 20 yr old female who presents with MDD, Recurrent, Severe, Anxiety, 3 Previous Suicide attempts (overdose on Ibuprofen), and 2 previous admissions to the child unit (2018, 2019).   She reported that she was unpleased by the events yesterday. Discussed that I had to move her closer to the nurses desk, remove everything from the room and have her change into the paper scrubs because of the threats to herself she made. Encouraged attending group therapy to develop and refine coping skills so that in the future these situations could be avoided. She reported that she was not going to go to any groups and just wanted to be left alone in her room. Encouraged her to consider attending tomorrow. She reported no SI, HI, or AVH. Later in the day she was seen out on the unit interacting with other patients.    Spoke with patients father- He wanted to know who she was doing since the incident yesterday. Discussed that I had an in depth conversation about appropriate ways to express herself because when certain things are said such as harming herself it is taken very seriously. He was understanding of this.   Principal Problem: MDD (major depressive disorder), recurrent severe, without psychosis (HCC) Diagnosis: Principal Problem:   MDD (major depressive disorder), recurrent severe, without psychosis (HCC) Active Problems:   Suicide ideation  Total Time spent with patient: 15 minutes  Past Psychiatric History: MDD, Recurrent, Severe, Anxiety, 3 Previous Suicide attempts (overdose on Ibuprofen), and 2 previous admissions to the child unit (2018, 2019)  Past Medical History:  Past Medical History:  Diagnosis Date  . ADHD (attention deficit hyperactivity disorder)   . Anxiety   . Attention deficit disorder   . Depression   . Irregular heartbeat    Premature Atrial Contractions/ Palpitations  . Urinary  tract infection    in past, but not currently   History reviewed. No pertinent surgical history. Family History:  Family History  Problem Relation Age of Onset  . Cancer Maternal Grandmother   . Cancer Paternal Grandmother   . Hyperlipidemia Father   . Hypertension Father    Family Psychiatric  History: Mother- Depressed Father- Anxiety Social History:  Social History   Substance and Sexual Activity  Alcohol Use No  . Alcohol/week: 0.0 standard drinks   Comment: Once a month.     Social History   Substance and Sexual Activity  Drug Use Yes  . Types: Marijuana   Comment: occasionally    Social History   Socioeconomic History  . Marital status: Single    Spouse name: Not on file  . Number of children: Not on file  . Years of education: Not on file  . Highest education level: Not on file  Occupational History  . Not on file  Tobacco Use  . Smoking status: Never Smoker  . Smokeless tobacco: Never Used  Substance and Sexual Activity  . Alcohol use: No    Alcohol/week: 0.0 standard drinks    Comment: Once a month.  . Drug use: Yes    Types: Marijuana    Comment: occasionally  . Sexual activity: Yes    Birth control/protection: Pill  Other Topics Concern  . 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  Social Determinants of Health   Financial Resource Strain: Not on file  Food Insecurity: Not on file  Transportation Needs: Not on file  Physical Activity: Not on file  Stress: Not on file  Social Connections: Not on file   Additional Social History:                         Sleep: Good  Appetite:  Good  Current Medications: Current Facility-Administered Medications  Medication Dose Route Frequency Provider Last Rate Last Admin  . acetaminophen (TYLENOL) tablet 650 mg  650 mg Oral Q6H PRN Nira Conn A, NP      . alum & mag hydroxide-simeth  (MAALOX/MYLANTA) 200-200-20 MG/5ML suspension 30 mL  30 mL Oral Q4H PRN Nira Conn A, NP      . hydrOXYzine (ATARAX/VISTARIL) tablet 25 mg  25 mg Oral Q6H PRN Armandina Stammer I, NP   25 mg at 04/29/20 2305  . OLANZapine zydis (ZYPREXA) disintegrating tablet 5 mg  5 mg Oral Q8H PRN Lauro Franklin, MD       And  . LORazepam (ATIVAN) tablet 1 mg  1 mg Oral PRN Lauro Franklin, MD       And  . ziprasidone (GEODON) injection 20 mg  20 mg Intramuscular PRN Lauro Franklin, MD      . magnesium hydroxide (MILK OF MAGNESIA) suspension 30 mL  30 mL Oral Daily PRN Nira Conn A, NP      . nicotine (NICODERM CQ - dosed in mg/24 hours) patch 21 mg  21 mg Transdermal Daily Comer Locket, MD   21 mg at 04/29/20 0936  . traZODone (DESYREL) tablet 50 mg  50 mg Oral QHS PRN Armandina Stammer I, NP   50 mg at 04/29/20 2305  . venlafaxine XR (EFFEXOR-XR) 24 hr capsule 225 mg  225 mg Oral Q breakfast Lauro Franklin, MD   225 mg at 04/29/20 1525    Lab Results: No results found for this or any previous visit (from the past 48 hour(s)).  Blood Alcohol level:  Lab Results  Component Value Date   ETH <10 04/27/2020   ETH <10 04/28/2017    Metabolic Disorder Labs: Lab Results  Component Value Date   HGBA1C 5.6 02/01/2020   MPG 114 02/01/2020   MPG 108 11/06/2017   No results found for: PROLACTIN Lab Results  Component Value Date   CHOL 187 (H) 05/01/2017   TRIG 82 05/01/2017   HDL 70 05/01/2017   CHOLHDL 2.7 05/01/2017   VLDL 16 05/01/2017   LDLCALC 101 (H) 05/01/2017   LDLCALC 113 (H) 02/04/2017    Physical Findings: AIMS: Facial and Oral Movements Muscles of Facial Expression: None, normal Lips and Perioral Area: None, normal Jaw: None, normal Tongue: None, normal,Extremity Movements Upper (arms, wrists, hands, fingers): None, normal Lower (legs, knees, ankles, toes): None, normal, Trunk Movements Neck, shoulders, hips: None, normal, Overall Severity Severity of  abnormal movements (highest score from questions above): None, normal Incapacitation due to abnormal movements: None, normal Patient's awareness of abnormal movements (rate only patient's report): No Awareness, Dental Status Current problems with teeth and/or dentures?: No Does patient usually wear dentures?: No  CIWA:    COWS:     Musculoskeletal: Strength & Muscle Tone: within normal limits Gait & Station: normal Patient leans: N/A  Psychiatric Specialty Exam: Physical Exam Nursing note reviewed.  Constitutional:      General: She is not in acute distress.  Appearance: Normal appearance. She is normal weight. She is not ill-appearing, toxic-appearing or diaphoretic.  HENT:     Head: Normocephalic and atraumatic.  Cardiovascular:     Rate and Rhythm: Normal rate.  Pulmonary:     Effort: Pulmonary effort is normal.  Musculoskeletal:        General: Normal range of motion.  Neurological:     General: No focal deficit present.     Mental Status: She is alert.     Review of Systems  Constitutional: Negative for fatigue and fever.  Respiratory: Negative for chest tightness and shortness of breath.   Cardiovascular: Negative for chest pain and palpitations.  Gastrointestinal: Negative for abdominal pain, constipation, diarrhea, nausea and vomiting.  Neurological: Negative for dizziness, weakness, light-headedness and headaches.  Psychiatric/Behavioral: Negative for suicidal ideas.    Blood pressure 100/67, pulse (!) 130, temperature 98 F (36.7 C), temperature source Oral, resp. rate 14.There is no height or weight on file to calculate BMI.  General Appearance: Casual  Eye Contact:  Good  Speech:  Clear and Coherent and Normal Rate  Volume:  Normal  Mood:  Anxious and Depressed  Affect:  Depressed, Labile and Full Range  Thought Process:  Coherent  Orientation:  Full (Time, Place, and Person)  Thought Content:  Logical  Suicidal Thoughts:  No  Homicidal Thoughts:  No   Memory:  Immediate;   Good Recent;   Good  Judgement:  Intact  Insight:  Present  Psychomotor Activity:  Normal  Concentration:  Concentration: Fair and Attention Span: Fair  Recall:  Good  Fund of Knowledge:  Good  Language:  Good  Akathisia:  No  Handed:  Right  AIMS (if indicated):     Assets:  Resilience Social Support  ADL's:  Intact  Cognition:  WNL  Sleep:  Number of Hours: 6     Treatment Plan Summary: Daily contact with patient to assess and evaluate symptoms and progress in treatment  Dana Thompson is a 20 yr old female who presents with MDD, Recurrent, Severe, Anxiety, 3 Previous Suicide attempts (overdose on Ibuprofen), and 2 previous admissions to the child unit (2018, 2019).   Since she has had no further outbursts I have discontinued the 1 to 1 and allowed her to change back into her clothes. She was still standoffish but maintained a mostly normal tone of voice and had a complete conversation. She is still not wanting to start any new medications today but will continue to discuss with her about other medications. Will continue to monitor.   MDD, Recurrent, Severe: -Restart Effexor XR 225 mg daily -Will not restart Caplyta -Will not restart Vivance     Nicotine Dependence: -Continue Nicotine Patch 21 mg daily   -Continue PRN's: Tylenol, Maalox, Atarax, Milk of Magnesia, Trazodone   Lauro Franklin, MD 04/30/2020, 7:33 AM

## 2020-04-30 NOTE — Progress Notes (Signed)
   04/30/20 2252  COVID-19 Daily Checkoff  Have you had a fever (temp > 37.80C/100F)  in the past 24 hours?  No  If you have had runny nose, nasal congestion, sneezing in the past 24 hours, has it worsened? No  COVID-19 EXPOSURE  Have you traveled outside the state in the past 14 days? No  Have you been in contact with someone with a confirmed diagnosis of COVID-19 or PUI in the past 14 days without wearing appropriate PPE? No  Have you been living in the same home as a person with confirmed diagnosis of COVID-19 or a PUI (household contact)? No  Have you been diagnosed with COVID-19? No

## 2020-04-30 NOTE — Progress Notes (Signed)
Psychoeducational Group Note  Date:  04/30/2020 Time: 2015  Group Topic/Focus:  wrap up group  Participation Level: Did Not Attend  Participation Quality:  Not Applicable  Affect:  Not Applicable  Cognitive:  Not Applicable  Insight:  Not Applicable  Engagement in Group: Not Applicable  Additional Comments:  Pt remained in bed during group time.   Marcille Buffy 04/30/2020, 8:36 PM

## 2020-05-01 DIAGNOSIS — F332 Major depressive disorder, recurrent severe without psychotic features: Secondary | ICD-10-CM | POA: Diagnosis not present

## 2020-05-01 MED ORDER — TRAZODONE HCL 50 MG PO TABS
50.0000 mg | ORAL_TABLET | Freq: Every evening | ORAL | Status: DC | PRN
  Administered 2020-05-01 – 2020-05-02 (×3): 50 mg via ORAL
  Filled 2020-05-01 (×6): qty 1

## 2020-05-01 MED ORDER — LORAZEPAM 1 MG PO TABS
1.0000 mg | ORAL_TABLET | Freq: Once | ORAL | Status: AC | PRN
  Administered 2020-05-01: 1 mg via ORAL
  Filled 2020-05-01: qty 1

## 2020-05-01 NOTE — [facility] Group Notes (Signed)
Late entry for 04/30/20  Psychoeducational Group Note    Date:04/30/2020 Time: 1300-1400    Life Skills:  A group where two lists are made. What people need and what are things that we do that are healthy. The lists are developed by the patients and it is explained that we often do the actions that are not healthy to get our list of needs met.   Purpose of Group: . The group focus' on teaching patients on how to identify their needs and how to develop the coping skills needed to get their needs met  Participation Level:  Did not attend   Dana Thompson

## 2020-05-01 NOTE — Progress Notes (Signed)
[facility] MD Progress Note  05/01/2020 7:52 PM Dana Thompson  MRN:  [id number]   Subjective: "I am doing well.  "    Dana Thompson is a 20 yr old female who presents with MDD, Recurrent, Severe, Anxiety, 3 Previous Suicide attempts (overdose on Ibuprofen), and 2 previous admissions to the child unit (2018, 2019).   Patient is seen, chart is reviewed.  Patient has not had any further outbursts.  She is seen lying comfortably in her bed at the start of shift.  She reports sleeping well and eating well.  She is smiling and describes a good mood.  She is later seen in the milieu, however she did not attend any groups today despite encouragement.  She continues to deny SI, HI, or AVH.  When discussing ECT, she states that she would rather not start ECT treatment at this time, but would consider it at a later date if needed.    Dr. Renaldo Fiddler spoke with patients father 04/30/2020- He wanted to know who she was doing since the incident yesterday. Discussed that I had an in depth conversation about appropriate ways to express herself because when certain things are said such as harming herself it is taken very seriously. He was understanding of this.   Principal Problem: MDD (major depressive disorder), recurrent severe, without psychosis (HCC) Diagnosis: Principal Problem:   MDD (major depressive disorder), recurrent severe, without psychosis (HCC) Active Problems:   Suicide ideation  Total Time spent with patient: 15 minutes  Past Psychiatric History: MDD, Recurrent, Severe, Anxiety, 3 Previous Suicide attempts (overdose on Ibuprofen), and 2 previous admissions to the child unit (2018, 2019)  Past Medical History:  Past Medical History:  Diagnosis Date  . ADHD (attention deficit hyperactivity disorder)   . Anxiety   . Attention deficit disorder   . Depression   . Irregular heartbeat    Premature Atrial Contractions/ Palpitations  . Urinary tract infection    in past, but not currently   History  reviewed. No pertinent surgical history. Family History:  Family History  Problem Relation Age of Onset  . Cancer Maternal Grandmother   . Cancer Paternal Grandmother   . Hyperlipidemia Father   . Hypertension Father    Family Psychiatric  History: Mother- Depressed Father- Anxiety Social History:  Social History   Substance and Sexual Activity  Alcohol Use No  . Alcohol/week: 0.0 standard drinks   Comment: Once a month.     Social History   Substance and Sexual Activity  Drug Use Yes  . Types: Marijuana   Comment: occasionally    Social History   Socioeconomic History  . Marital status: Single    Spouse name: Not on file  . Number of children: Not on file  . Years of education: Not on file  . Highest education level: Not on file  Occupational History  . Not on file  Tobacco Use  . Smoking status: Never Smoker  . Smokeless tobacco: Never Used  Substance and Sexual Activity  . Alcohol use: No    Alcohol/week: 0.0 standard drinks    Comment: Once a month.  . Drug use: Yes    Types: Marijuana    Comment: occasionally  . Sexual activity: Yes    Birth control/protection: Pill  Other Topics Concern  . 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    Social  Determinants of Health   Financial Resource Strain: Not on file  Food Insecurity: Not on file  Transportation Needs: Not on file  Physical Activity: Not on file  Stress: Not on file  Social Connections: Not on file   Additional Social History:                         Sleep: Good  Appetite:  Good  Current Medications: Current Facility-Administered Medications  Medication Dose Route Frequency Provider Last Rate Last Admin  . acetaminophen (TYLENOL) tablet 650 mg  650 mg Oral Q6H PRN Nira Conn A, NP      . alum & mag hydroxide-simeth (MAALOX/MYLANTA) 200-200-20 MG/5ML suspension 30 mL  30 mL Oral Q4H PRN  Nira Conn A, NP      . hydrOXYzine (ATARAX/VISTARIL) tablet 25 mg  25 mg Oral Q6H PRN Armandina Stammer I, NP   25 mg at 05/01/20 1932  . OLANZapine zydis (ZYPREXA) disintegrating tablet 5 mg  5 mg Oral Q8H PRN Lauro Franklin, MD       And  . LORazepam (ATIVAN) tablet 1 mg  1 mg Oral PRN Lauro Franklin, MD       And  . ziprasidone (GEODON) injection 20 mg  20 mg Intramuscular PRN Lauro Franklin, MD      . magnesium hydroxide (MILK OF MAGNESIA) suspension 30 mL  30 mL Oral Daily PRN Nira Conn A, NP      . nicotine (NICODERM CQ - dosed in mg/24 hours) patch 21 mg  21 mg Transdermal Daily Mason Jim, Amy E, MD   21 mg at 05/01/20 0800  . traZODone (DESYREL) tablet 50 mg  50 mg Oral QHS PRN Armandina Stammer I, NP   50 mg at 04/30/20 2104  . venlafaxine XR (EFFEXOR-XR) 24 hr capsule 225 mg  225 mg Oral Q breakfast Lauro Franklin, MD   225 mg at 05/01/20 0800    Lab Results: No results found for this or any previous visit (from the past 48 hour(s)).  Blood Alcohol level:  Lab Results  Component Value Date   ETH <10 04/27/2020   ETH <10 04/28/2017    Metabolic Disorder Labs: Lab Results  Component Value Date   HGBA1C 5.6 02/01/2020   MPG 114 02/01/2020   MPG 108 11/06/2017   No results found for: PROLACTIN Lab Results  Component Value Date   CHOL 187 (H) 05/01/2017   TRIG 82 05/01/2017   HDL 70 05/01/2017   CHOLHDL 2.7 05/01/2017   VLDL 16 05/01/2017   LDLCALC 101 (H) 05/01/2017   LDLCALC 113 (H) 02/04/2017    Physical Findings: AIMS: Facial and Oral Movements Muscles of Facial Expression: None, normal Lips and Perioral Area: None, normal Jaw: None, normal Tongue: None, normal,Extremity Movements Upper (arms, wrists, hands, fingers): None, normal Lower (legs, knees, ankles, toes): None, normal, Trunk Movements Neck, shoulders, hips: None, normal, Overall Severity Severity of abnormal movements (highest score from questions above): None,  normal Incapacitation due to abnormal movements: None, normal Patient's awareness of abnormal movements (rate only patient's report): No Awareness, Dental Status Current problems with teeth and/or dentures?: No Does patient usually wear dentures?: No  CIWA:    COWS:     Musculoskeletal: Strength & Muscle Tone: within normal limits Gait & Station: normal Patient leans: N/A  Psychiatric Specialty Exam: Physical Exam Vitals and nursing note reviewed.  Constitutional:      General: She is not in acute  distress.    Appearance: Normal appearance. She is normal weight. She is not ill-appearing, toxic-appearing or diaphoretic.  HENT:     Head: Normocephalic and atraumatic.  Cardiovascular:     Rate and Rhythm: Tachycardia present.  Pulmonary:     Effort: Pulmonary effort is normal.  Musculoskeletal:        General: Normal range of motion.  Neurological:     General: No focal deficit present.     Mental Status: She is alert.     Review of Systems  Constitutional: Negative for fatigue and fever.  HENT: Negative.   Respiratory: Negative for chest tightness and shortness of breath.   Cardiovascular: Negative for chest pain and palpitations.  Gastrointestinal: Negative for abdominal pain, constipation, diarrhea, nausea and vomiting.  Musculoskeletal: Negative.   Neurological: Negative for dizziness, weakness, light-headedness and headaches.  Psychiatric/Behavioral: Positive for sleep disturbance. Negative for agitation, behavioral problems, confusion, decreased concentration, dysphoric mood, hallucinations, self-injury and suicidal ideas. The patient is not nervous/anxious and is not hyperactive.     Blood pressure 108/76, pulse (!) 105, temperature 98.4 F (36.9 C), temperature source Oral, resp. rate 16, SpO2 97 %.There is no height or weight on file to calculate BMI.  General Appearance: Casual  Eye Contact:  Good  Speech:  Clear and Coherent and Normal Rate  Volume:  Normal   Mood:  Euthymic  Affect:  Congruent  Thought Process:  Coherent  Orientation:  Full (Time, Place, and Person)  Thought Content:  Logical  Suicidal Thoughts:  No  Homicidal Thoughts:  No  Memory:  Immediate;   Good Recent;   Good  Judgement:  Intact  Insight:  Present  Psychomotor Activity:  Normal  Concentration:  Concentration: Fair and Attention Span: Fair  Recall:  Good  Fund of Knowledge:  Good  Language:  Good  Akathisia:  No  Handed:  Right  AIMS (if indicated):     Assets:  Resilience Social Support  ADL's:  Intact  Cognition:  WNL  Sleep:  Number of Hours: 4.25     Treatment Plan Summary: Daily contact with patient to assess and evaluate symptoms and progress in treatment  Dana Thompson is a 20 yr old female who presents with MDD, Recurrent, Severe, Anxiety, 3 Previous Suicide attempts (overdose on Ibuprofen), and 2 previous admissions to the child unit (2018, 2019).   Since she has had no further outbursts.  Her mood is significantly improved, however she still does not attend group therapy.  I have encouraged her to continue to be active in the milieu as she can tolerate.  She would benefit from a family meeting prior to discharge.   MDD, Recurrent, Severe: -Continue Effexor XR 225 mg daily -Will not restart Caplyta -Will not restart Vyvance     Nicotine Dependence: -Continue Nicotine Patch 21 mg daily   -Continue PRN's: Tylenol, Maalox, Atarax, Milk of Magnesia, Trazodone   Mariel Craft, MD 05/01/2020, 7:52 PM

## 2020-05-01 NOTE — Progress Notes (Signed)
Patient presents with a flat affect and anxious mood. She rates depression 4/10 and anxiety 4/10 (10 being worst). She reported increased anxiety this morning and requested Vistaril. Vistaril was administered to the pt per her request. She denies SI/HI and verbally contracts for safety. She reports fair sleep last night. She continues to isolate in her room and decline to attend groups due to her anxiety.   Orders reviewed. Vital signs reviewed. Verbal support provided.  15 minute checks performed for safety.   Patient compliant with treatment plan.

## 2020-05-01 NOTE — Progress Notes (Signed)
   05/01/20 2219  COVID-19 Daily Checkoff  Have you had a fever (temp > 37.80C/100F)  in the past 24 hours?  No  If you have had runny nose, nasal congestion, sneezing in the past 24 hours, has it worsened? No  COVID-19 EXPOSURE  Have you traveled outside the state in the past 14 days? No  Have you been in contact with someone with a confirmed diagnosis of COVID-19 or PUI in the past 14 days without wearing appropriate PPE? No  Have you been living in the same home as a person with confirmed diagnosis of COVID-19 or a PUI (household contact)? No  Have you been diagnosed with COVID-19? No

## 2020-05-01 NOTE — Progress Notes (Signed)
Psychoeducational Group Note  Date:  05/01/2020 Time:  2030  Group Topic/Focus:  wrap up group  Participation Level: Did Not Attend  Participation Quality:  Not Applicable  Affect:  Not Applicable  Cognitive:  Not Applicable  Insight:  Not Applicable  Engagement in Group: Not Applicable  Additional Comments: Pt remained in room during group time.   Marcille Buffy 05/01/2020, 8:35 PM

## 2020-05-01 NOTE — Progress Notes (Signed)
D.  Pt approached with complaint of anxiety.  Pt given prn Vistaril, but found to be standing in room in dark and tearful.  Pt did not elaborate on reasons for anxiety.  Pt did not attend evening wrap up group.    A.  PA notified of Pt's continued anxiety and orders received.  Medications provided as ordered, supportand encouragement offered to Pt.  R.  Pt appeared calmer, did go to bed.  Pt given ginger ale at her request.  Will continue to monitor.

## 2020-05-01 NOTE — [facility] Group Notes (Addendum)
Adult Psychoeducational Group Not Date:  05/01/2020 Time:  9432-7614 Group Topic/Focus: PROGRESSIVE RELAXATION. A group where deep breathing is taught and tensing and relaxation muscle groups is used. Imagery is used as well.  Pts are asked to imagine 3 pillars that hold them up when they are not able to hold themselves up.  Participation Level:  Did not attend   Dione Housekeeper

## 2020-05-01 NOTE — [facility] Group Notes (Signed)
[facility] LCSW Group Therapy Note  05/01/2020   10:00-11:00AM  Group Therapy:  Unhealthy versus Healthy Supports, Which Am I?  Participation Level:  Did Not Attend   Description of Group:  Patients in this group were introduced to the concept that additional supports including self-support are an essential part of recovery.  Initially a discussion was held about the differences between healthy versus unhealthy supports.  Group then talked about adding a variety of healthy supports to address the various needs in patient lives, including becoming more self-supportive.   Two songs entitled "Love Myself" and "My Own Hero" were played.  A group discussion ensued in which patients stated they could relate to the songs which inspired them to be more willing to help themselves in order to succeed, because other people cannot achieve sobriety or stability for them.  Several patients were tearful as they discussed the difficulty in loving oneself.  A song was played called "I Am Enough " toward the end of group and used to conduct an inspirational wrap-up to group of believing in their own self-worth.  They were asked to name one way in which they are willing to work on improving their own self-support at discharge.  Therapeutic Goals: 1)  Highlight the differences between healthy and unhealthy supports 2)  Suggest the importance of being a part of one's own support system 2)  Listen to songs which can be an encouragement on an ongoing basis  3)  Identify ways in which the patient can improve attitudes toward self and willingness to help self   4) Elicit commitments to add healthy supports and to become more conscious of being self-supportive   Summary of Patient Progress:  The patient was invited to group but opted not to attend  Therapeutic Modalities:   Motivational Interviewing Activity  Lynnell Chad , MSW, LCSW

## 2020-05-01 NOTE — [facility] Group Notes (Signed)
Psychoeducational Group Note  Date: 05-01-20 Time:  1300  Group Topic/Focus:  Making Healthy Choices:   The focus of this group is to help patients identify negative/unhealthy choices they were using prior to admission and identify positive/healthier coping strategies to replace them upon discharge.In this group, patients started asking about the brain and how the brain works with and how the chemicals work for those who use substances, the pros and cons of saboxone.  Participation Level:  Did not attend   Dione Housekeeper

## 2020-05-02 NOTE — [facility] Group Notes (Signed)
[facility] LCSW Group Therapy  05/02/2020 1:26 PM  Type of Therapy:  Group Therapy: Stress Management  Participation Level:  Did Not Attend  Dana Thompson Dana Thompson 05/02/2020, 1:26 PM  

## 2020-05-02 NOTE — Plan of Care (Signed)
Nurse discussed coping skills with patient.  

## 2020-05-02 NOTE — [facility] Group Notes (Signed)
Occupational Therapy Group Note Date: 05/02/2020 Group Topic/Focus: Self-Esteem  Group Description: Group encouraged increased engagement and participation through discussion and activity focused on self-esteem. Patients explored and discussed the differences between healthy and low self-esteem and how it affects our daily lives and occupations with a focus on relationships, work, school, self-care, and personal leisure interests. Group discussion then transitioned into identifying specific strategies to boost self-esteem and engaged in a collaborative and independent activity looking at positive ways to describe oneself A-Z.   Therapeutic Goal(s): Understand and recognize the differences between healthy and low self-esteem Identify healthy strategies to improve/build self-esteem Participation Level: Patient did not attend OT group session despite personal invitation.     Plan: Continue to engage patient in OT groups 2 - 3x/week.  05/02/2020  Akia Montalban, MOT, OTR/L  

## 2020-05-02 NOTE — Progress Notes (Signed)
The patient rated her day as a 6 out of 10 since she felt bored. The patient explained that she is accustomed to keeping herself busy by reading . Her goal for tomorrow is to "get better" and or go home.

## 2020-05-02 NOTE — Progress Notes (Signed)
[facility] MD Progress Note  05/02/2020 7:04 AM Dana Thompson  MRN:  [id number] Subjective:   Dana Thompson is a 20 yr old female who presents with MDD, Recurrent, Severe, Anxiety, 3 Previous Suicide attempts (overdose on Ibuprofen), and 2 previous admissions to the child unit (2018, 2019).  She started our conversation with asking if she would be discharged today. Discussed that she had a good day yesterday but given her statements early in her hospitalizations she would need to have several days in a row to demonstrate safety. She began to get upset. When asked why she thought she would be discharged today she said she did not know. She then asked why I had not started new medications already and I said because I cannot force medicate her and she would need to agree to take it. I said I would come back to discuss her decision later after some time to think.   Spoke with patients father about my discussion with the patient earlier. Stated that I was unable to start medications without her wanting to start anything. He stated understanding.  Principal Problem: MDD (major depressive disorder), recurrent severe, without psychosis (HCC) Diagnosis: Principal Problem:   MDD (major depressive disorder), recurrent severe, without psychosis (HCC) Active Problems:   Suicide ideation  Total Time spent with patient: 15 minutes  Past Psychiatric History: MDD, Recurrent, Severe, Anxiety, 3 Previous Suicide attempts (overdose on Ibuprofen), and 2 previous admissions to the child unit (2018, 2019)  Past Medical History:  Past Medical History:  Diagnosis Date  . ADHD (attention deficit hyperactivity disorder)   . Anxiety   . Attention deficit disorder   . Depression   . Irregular heartbeat    Premature Atrial Contractions/ Palpitations  . Urinary tract infection    in past, but not currently   History reviewed. No pertinent surgical history. Family History:  Family History  Problem Relation Age of Onset  .  Cancer Maternal Grandmother   . Cancer Paternal Grandmother   . Hyperlipidemia Father   . Hypertension Father    Family Psychiatric  History: Mother- Depressed Social History:  Social History   Substance and Sexual Activity  Alcohol Use No  . Alcohol/week: 0.0 standard drinks   Comment: Once a month.     Social History   Substance and Sexual Activity  Drug Use Yes  . Types: Marijuana   Comment: occasionally    Social History   Socioeconomic History  . Marital status: Single    Spouse name: Not on file  . Number of children: Not on file  . Years of education: Not on file  . Highest education level: Not on file  Occupational History  . Not on file  Tobacco Use  . Smoking status: Never Smoker  . Smokeless tobacco: Never Used  Substance and Sexual Activity  . Alcohol use: No    Alcohol/week: 0.0 standard drinks    Comment: Once a month.  . Drug use: Yes    Types: Marijuana    Comment: occasionally  . Sexual activity: Yes    Birth control/protection: Pill  Other Topics Concern  . 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    Social Determinants of Health   Financial Resource Strain: Not on file  Food Insecurity: Not on file  Transportation Needs: Not on file  Physical Activity: Not on file  Stress:  Not on file  Social Connections: Not on file   Additional Social History:                         Sleep: Good  Appetite:  Good  Current Medications: Current Facility-Administered Medications  Medication Dose Route Frequency Provider Last Rate Last Admin  . acetaminophen (TYLENOL) tablet 650 mg  650 mg Oral Q6H PRN Nira Conn A, NP      . alum & mag hydroxide-simeth (MAALOX/MYLANTA) 200-200-20 MG/5ML suspension 30 mL  30 mL Oral Q4H PRN Nira Conn A, NP      . hydrOXYzine (ATARAX/VISTARIL) tablet 25 mg  25 mg Oral Q6H PRN Armandina Stammer I, NP   25 mg at  05/01/20 1932  . OLANZapine zydis (ZYPREXA) disintegrating tablet 5 mg  5 mg Oral Q8H PRN Lauro Franklin, MD   5 mg at 05/01/20 2059   And  . LORazepam (ATIVAN) tablet 1 mg  1 mg Oral PRN Lauro Franklin, MD       And  . ziprasidone (GEODON) injection 20 mg  20 mg Intramuscular PRN Lauro Franklin, MD      . magnesium hydroxide (MILK OF MAGNESIA) suspension 30 mL  30 mL Oral Daily PRN Nira Conn A, NP      . nicotine (NICODERM CQ - dosed in mg/24 hours) patch 21 mg  21 mg Transdermal Daily Mason Jim, Amy E, MD   21 mg at 05/01/20 0800  . traZODone (DESYREL) tablet 50 mg  50 mg Oral QHS,MR X 1 Jaclyn Shaggy, PA-C   50 mg at 05/01/20 2059  . venlafaxine XR (EFFEXOR-XR) 24 hr capsule 225 mg  225 mg Oral Q breakfast Lauro Franklin, MD   225 mg at 05/01/20 0800    Lab Results: No results found for this or any previous visit (from the past 48 hour(s)).  Blood Alcohol level:  Lab Results  Component Value Date   ETH <10 04/27/2020   ETH <10 04/28/2017    Metabolic Disorder Labs: Lab Results  Component Value Date   HGBA1C 5.6 02/01/2020   MPG 114 02/01/2020   MPG 108 11/06/2017   No results found for: PROLACTIN Lab Results  Component Value Date   CHOL 187 (H) 05/01/2017   TRIG 82 05/01/2017   HDL 70 05/01/2017   CHOLHDL 2.7 05/01/2017   VLDL 16 05/01/2017   LDLCALC 101 (H) 05/01/2017   LDLCALC 113 (H) 02/04/2017    Physical Findings: AIMS: Facial and Oral Movements Muscles of Facial Expression: None, normal Lips and Perioral Area: None, normal Jaw: None, normal Tongue: None, normal,Extremity Movements Upper (arms, wrists, hands, fingers): None, normal Lower (legs, knees, ankles, toes): None, normal, Trunk Movements Neck, shoulders, hips: None, normal, Overall Severity Severity of abnormal movements (highest score from questions above): None, normal Incapacitation due to abnormal movements: None, normal Patient's awareness of abnormal movements  (rate only patient's report): No Awareness, Dental Status Current problems with teeth and/or dentures?: No Does patient usually wear dentures?: No  CIWA:    COWS:     Musculoskeletal: Strength & Muscle Tone: within normal limits Gait & Station: normal Patient leans: N/A  Psychiatric Specialty Exam: Physical Exam Vitals and nursing note reviewed.  Constitutional:      General: She is not in acute distress.    Appearance: Normal appearance. She is normal weight. She is not ill-appearing, toxic-appearing or diaphoretic.  HENT:     Head: Normocephalic and  atraumatic.  Cardiovascular:     Rate and Rhythm: Normal rate.  Pulmonary:     Effort: Pulmonary effort is normal.  Musculoskeletal:        General: Normal range of motion.  Neurological:     General: No focal deficit present.     Mental Status: She is alert.     Review of Systems  Constitutional: Negative for fatigue and fever.  Respiratory: Negative for chest tightness and shortness of breath.   Cardiovascular: Negative for chest pain and palpitations.  Gastrointestinal: Negative for abdominal pain, constipation, diarrhea, nausea and vomiting.  Neurological: Negative for dizziness, weakness, light-headedness and headaches.  Psychiatric/Behavioral: Negative for suicidal ideas.    Blood pressure 108/76, pulse (!) 105, temperature 98.4 F (36.9 C), temperature source Oral, resp. rate 16, SpO2 97 %.There is no height or weight on file to calculate BMI.  General Appearance: Casual  Eye Contact:  Fair  Speech:  Clear and Coherent and Normal Rate  Volume:  Normal  Mood:  Anxious and Depressed  Affect:  Depressed  Thought Process:  Coherent and Goal Directed  Orientation:  Full (Time, Place, and Person)  Thought Content:  Logical  Suicidal Thoughts:  No  Homicidal Thoughts:  No  Memory:  Immediate;   Good Recent;   Good  Judgement:  Intact  Insight:  Present  Psychomotor Activity:  Normal  Concentration:  Concentration:  Good and Attention Span: Good  Recall:  Good  Fund of Knowledge:  Good  Language:  Good  Akathisia:  No  Handed:  Right  AIMS (if indicated):     Assets:  Resilience Social Support  ADL's:  Intact  Cognition:  WNL  Sleep:  Number of Hours: 5.75     Treatment Plan Summary: Daily contact with patient to assess and evaluate symptoms and progress in treatment  Dana Thompson is a 20 yr old female who presents with MDD, Recurrent, Severe, Anxiety, 3 Previous Suicide attempts (overdose on Ibuprofen), and 2 previous admissions to the child unit (2018, 2019).  She has continued to do well. She still has some emotional outbursts but is more stable and not making threats. She is out on the unit more but I am still recommending she attend group therapy. Will continue to monitor. Will make medication changes if she is agreeable to them otherwise will continue with Effexor XR 225 daily for now.   MDD, Recurrent, Severe: -Continue Effexor XR 225 mg daily   Nicotine Dependence: -Continue Nicotine Patch 21 mg daily   -Continue PRN's: Tylenol, Maalox, Atarax, Milk of Magnesia, Trazodone   Lauro Franklin, MD 05/02/2020, 7:04 AM

## 2020-05-02 NOTE — Progress Notes (Signed)
D:  Patient denied SI and HI, contracts for safety.  Denied A/V hallucinations.  Denied pain. A:  Medications administered per MD orders.  Emotional support and encouragement given patient. R:  Safety maintained with 15 minute checks.  

## 2020-05-03 LAB — HEMOGLOBIN A1C
Hgb A1c MFr Bld: 5.5 % (ref 4.8–5.6)
Mean Plasma Glucose: 111.15 mg/dL

## 2020-05-03 LAB — LIPID PANEL
Cholesterol: 185 mg/dL (ref 0–200)
HDL: 45 mg/dL (ref >40)
LDL Cholesterol: 127 mg/dL — ABNORMAL HIGH (ref 0–99)
Total CHOL/HDL Ratio: 4.1 RATIO
Triglycerides: 67 mg/dL (ref <150)
VLDL: 13 mg/dL (ref 0–40)

## 2020-05-03 LAB — TSH: TSH: 1.558 u[IU]/mL (ref 0.350–4.500)

## 2020-05-03 NOTE — Progress Notes (Signed)
Cooperative with treatment and pleasant om approach, medication compliant, she denies SI, HI and AVH. Patient is hoping for discharge tomorrow she is currently in bed resting quietly. No behavioral issues to report on shift at this time.

## 2020-05-03 NOTE — Progress Notes (Signed)
  [facility] Adult Case Management Discharge Plan :  Will you be returning to the same living situation after discharge:  Yes,  Home  At discharge, do you have transportation home?: Yes,  Family Do you have the ability to pay for your medications: Yes,  Insurance   Release of information consent forms completed and in the chart;  Patient's signature needed at discharge.  Patient to Follow up at:  Follow-up Information    Center[facility]. Go on 05/19/2020.   Specialty: Behavioral Health Why: You 05/19/20 at 1:30 pm for medication management. You also are scheduled for an appointment for therapy services on 05/27/20 at 9:30 am.  These appointments will be held in person. Contact information: [address] [place] [address] [phone]               Next level of care provider has access to [facility] Link:no  Safety Planning and Suicide Prevention discussed: Yes,  with father and patient   Have you used any form of tobacco in the last 30 days? (Cigarettes, Smokeless Tobacco, Cigars, and/or Pipes): Yes  Has patient been referred to the Quitline?: Patient refused referral  Patient has been referred for addiction treatment: Pt. refused referral  Aram Beecham, LCSWA 05/03/2020, 9:05 AM

## 2020-05-03 NOTE — Progress Notes (Signed)
  [facility] Adult Case Management Discharge Plan :  Will you be returning to the same living situation after discharge:  Yes,  Home At discharge, do you have transportation home?: Yes,  Family  Do you have the ability to pay for your medications: Yes,  Insurance   Release of information consent forms completed and in the chart;  Patient's signature needed at discharge.  Patient to Follow up at:  Follow-up Information    [facility]. Go on 05/19/2020.   Specialty: Behavioral Health Why: You 05/19/20 at 1:30 pm for medication management. You also are scheduled for an appointment for therapy services on 05/27/20 at 9:30 am.  These appointments will be held in person. Contact information: [address] [place] [address] [phone]        Clapacs, Jackquline Denmark, MD Follow up.   Specialty: Psychiatry Why: Please contact this provider to schedule an intake for ECT.  Contact information: [address] [place] [address] [phone]               Next level of care provider has access to [facility] Link:no  Safety Planning and Suicide Prevention discussed: Yes,  with father and patient   Have you used any form of tobacco in the last 30 days? (Cigarettes, Smokeless Tobacco, Cigars, and/or Pipes): Yes  Has patient been referred to the Quitline?: Patient refused referral  Patient has been referred for addiction treatment: Pt. refused referral  Aram Beecham, LCSWA 05/03/2020, 9:48 AM

## 2020-05-03 NOTE — Progress Notes (Signed)
D:  Patient denied SI and HI, contracts for safety.  Denied A/V hallucinations.  Denied pain. A:  Medications administered per MD orders.  Emotional support and encouragement given patient. R:  Safety maintained with 15 minute checks.  

## 2020-05-03 NOTE — Plan of Care (Signed)
Discharge note  Patient verbalizes readiness for discharge. Follow up plan explained, AVS, Transition record and SRA given. Prescriptions and teaching provided. Belongings returned and signed for. Suicide safety plan completed and signed. Patient verbalizes understanding. Patient denies SI/HI and assures this Clinical research associate they will seek assistance should that change. Patient discharged to lobby where aunt was waiting.  Problem: Education: Goal: Knowledge of [place] [facility] Education information/materials will improve Outcome: Adequate for Discharge Goal: Emotional status will improve Outcome: Adequate for Discharge Goal: Mental status will improve Outcome: Adequate for Discharge Goal: Verbalization of understanding the information provided will improve Outcome: Adequate for Discharge   Problem: Activity: Goal: Interest or engagement in activities will improve Outcome: Adequate for Discharge Goal: Sleeping patterns will improve Outcome: Adequate for Discharge   Problem: Coping: Goal: Ability to verbalize frustrations and anger appropriately will improve Outcome: Adequate for Discharge Goal: Ability to demonstrate self-control will improve Outcome: Adequate for Discharge   Problem: Health Behavior/Discharge Planning: Goal: Identification of resources available to assist in meeting health care needs will improve Outcome: Adequate for Discharge Goal: Compliance with treatment plan for underlying cause of condition will improve Outcome: Adequate for Discharge   Problem: Physical Regulation: Goal: Ability to maintain clinical measurements within normal limits will improve Outcome: Adequate for Discharge   Problem: Safety: Goal: Periods of time without injury will increase Outcome: Adequate for Discharge   Problem: Education: Goal: Ability to make informed decisions regarding treatment will improve Outcome: Adequate for Discharge   Problem: Coping: Goal: Coping ability will  improve Outcome: Adequate for Discharge   Problem: Health Behavior/Discharge Planning: Goal: Identification of resources available to assist in meeting health care needs will improve Outcome: Adequate for Discharge   Problem: Medication: Goal: Compliance with prescribed medication regimen will improve Outcome: Adequate for Discharge   Problem: Self-Concept: Goal: Ability to disclose and discuss suicidal ideas will improve Outcome: Adequate for Discharge Goal: Will verbalize positive feelings about self Outcome: Adequate for Discharge   Problem: Education: Goal: Ability to state activities that reduce stress will improve Outcome: Adequate for Discharge   Problem: Coping: Goal: Ability to identify and develop effective coping behavior will improve Outcome: Adequate for Discharge   Problem: Self-Concept: Goal: Ability to identify factors that promote anxiety will improve Outcome: Adequate for Discharge Goal: Level of anxiety will decrease Outcome: Adequate for Discharge Goal: Ability to modify response to factors that promote anxiety will improve Outcome: Adequate for Discharge

## 2020-05-03 NOTE — Plan of Care (Signed)
Nurse discussed coping skills with patient.  
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
## 2020-06-02 ENCOUNTER — Encounter (INDEPENDENT_AMBULATORY_CARE_PROVIDER_SITE_OTHER): Payer: Self-pay | Admitting: "Endocrinology

## 2020-06-02 ENCOUNTER — Ambulatory Visit (INDEPENDENT_AMBULATORY_CARE_PROVIDER_SITE_OTHER): Payer: 59 | Admitting: "Endocrinology

## 2020-06-02 ENCOUNTER — Other Ambulatory Visit: Payer: Self-pay

## 2020-06-02 VITALS — BP 116/72 | HR 74 | Wt 182.0 lb

## 2020-06-02 DIAGNOSIS — R7303 Prediabetes: Secondary | ICD-10-CM | POA: Diagnosis not present

## 2020-06-02 DIAGNOSIS — Z6830 Body mass index (BMI) 30.0-30.9, adult: Secondary | ICD-10-CM

## 2020-06-02 DIAGNOSIS — E6609 Other obesity due to excess calories: Secondary | ICD-10-CM | POA: Diagnosis not present

## 2020-06-02 DIAGNOSIS — E049 Nontoxic goiter, unspecified: Secondary | ICD-10-CM

## 2020-06-02 DIAGNOSIS — E063 Autoimmune thyroiditis: Secondary | ICD-10-CM

## 2020-06-02 DIAGNOSIS — R5383 Other fatigue: Secondary | ICD-10-CM

## 2020-06-02 LAB — POCT GLUCOSE (DEVICE FOR HOME USE): Glucose Fasting, POC: 108 mg/dL — AB (ref 70–99)

## 2020-06-02 NOTE — Patient Instructions (Signed)
Follow up visit in 3 months. Please repeat lab tests 1-2 weeks prior if possible.

## 2020-06-02 NOTE — Progress Notes (Signed)
Subjective:  Subjective  Patient Name: Dana Thompson Date of Birth: 2000/05/15  MRN: [id number]  Dana Thompson) Dana Thompson  presents to the office today for follow up evaluation and management of her pre-diabetes and goiter.  HISTORY OF PRESENT ILLNESS:   Dana Thompson is a 20 y.o. Caucasian young lady.   Francine was unaccompanied.  1. Bethaney's initial pediatric endocrine evaluation occurred on 05/19/14.   A. Perinatal history: Gestational Age: [redacted]w[redacted]d; 5 lb 7 oz (2.466 kg); Healthy newborn, but had meconium staining so she was on antibiotics for 7 days.   B. Infancy: Healthy  C. Childhood: ADHD, for which she took Concerta; anxiety attacks for which she took fluoxetine; several prior strep infections, and possible chicken pox; No surgeries; No allergies to medications; She had some seasonal allergy symptoms and signs each Spring.  D. Chief complaint:   1).  Mom and Dynver had discussed diabetes in the past when mom was in nursing school. Florie requested that a HbA1c value be drawn in August 2015. Mom reported that the value was 5.7%, c/w pre-diabetes. Follow up non-fasting lab tests on 12/06/13 showed a HbA1c of 5.9%, again c/w prediabetes, glucose of 85, cholesterol 145, triglycerides 54, HDL 55, and LDL 79.   2). Dana Thompson had been at camp for one month prior to the August tests and had been very active, After camp, however, she was fairly sedentary. After the September tests Dana Thompson was referred to Dr. [name] [name], RD, PhD, at the [facility] for nutritional counseling. Lifestyle changes had included reducing her intake of carbs and increasing her physical activity with volleyball and basketball.   E. Pertinent family history:   1). Obesity: Dad was overweight/obese. Mom gained about 40 pounds in the past year. Two paternal uncles were overweight.    2). DM: Maternal great grandmother developed DM at the end of her life.    3). Thyroid disease: None   4). ASCVD: Maternal grandfather had some form of heart disease.  Maternal grandfather also had a stroke due to fungal meningitis acquired in [place].    5). Cancers:  Paternal grandfather had throat CA. Mom's second cousin had brain cancer. Paternal grandmother had breast CA. Paternal grandaunt died of leukemia.   6). Others: Mom was recently diagnosed with fibromyalgia. Other members of her family had fibromyalgia.   F. Lifestyle:   1). Family diet: When Marymargaret was with mom they ate at restaurants about once a month. When she was with dad they ate at  restaurants about once a week.    2). Physical activities: None  2. Clinical course:  A. During the past 6 years, Dana Thompson has lost weight and gained weight several times, but the overall trend has been upward.  B. Her HbA1c values and her TFTs have remained normal.   C.  She had two covid vaccinations in April and May 2021.  D. She had her umbilicus pierced. She has been cleaning the site regularly and it has not been infected.     3. Dana Thompson's last [facility] visit occurred on 02/01/20. I had asked her to try to follow our Eat Right Diet and use the [facility] Diet recipes.   A. In the interim she had been fairly healthy until 04/27/20, when she presented to the ED with increasing anxiety and depression and thoughts of self harm. She has just quit her job. She was admitted to the [facility]. She was discharged on 05/03/20. Discharge medications included venlafaxine, Vyvanse, and a Nexplanon implant. She  feels a little bit better.   B. 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 .  C. She is now going to a gym 4 times per week. She goes to classes at the gym, bikes for 30 minutes, then does the treadmill for 30 minutes. She wants to start strength training.   D. She has not been eating well, but has been indulging. Ice cream is her favorite thing.  Dad has the attitude if you want to eat, eat.   E. She has not had any episodes of thyroid swelling and mild tenderness since  her last visit.   F. She now takes Effexor. She is holding Vyvanse while she is out of work. She also has a Nexplanon implant.   G. She did not complete her intent to seek a PCP.   H. She wants to lose weight in a healthy way. However, she does not like people commenting on her weight, even if they are complimentary. .    4. Pertinent Review of Systems:  Constitutional: Dana Thompson feels "fine". Her energy level is "fair". She is working out now.  She gets tired at the end of the day. She is sleeping "okay". She was drinking a lot of caffeine when she was working, but not now.  Eyes: Vision is okay, but she does need to see an eye doctor.   Neck: As above.   Heart: She has not been bothered by palpitations. She has a past history of PACs and has worn a Holter monitor before. Heart rate increases with exercise or other physical activity. She has no other complaints of irregular heart beats, chest pain, or chest pressure.   Gastrointestinal: She sometimes has stomach bloating after big meals. She has no complaints of excessive belly hunger, reflux, upset stomach, or epigastric pains. Bowel movents seem normal. The patient has no complaints of diarrhea or constipation.  Hands: No tremors Legs: There are no complaints of numbness, tingling, burning, or pain. No edema is noted.  Feet: Since she quit her job, she no longer has swelling and pains at the end of the day. There are no complaints of numbness, tingling, or burning.  Neurologic: There are no recognized problems with muscle movement and strength, sensation, or coordination. GYN: Menarche occurred just before her 67th birthday. Her LMP was a few months ago. She has a Nexplanon implant.   Psych: "I'm okay, but kind of blah. Everything is kind of muddled together. I'm feeling nothing, but also feeling everything." She still occasionally has thoughts of suicide, but she is not planning to commit suicide. She has not expressed these thoughts to her  psychiatrist, but will do so when she sees that doctor tomorrow. Her psychiatrist knows she was admitted in February 2022.   PAST MEDICAL, FAMILY, AND SOCIAL HISTORY  Past Medical History:  Diagnosis Date  . ADHD (attention deficit hyperactivity disorder)   . Anxiety   . Attention deficit disorder   . Depression   . Irregular heartbeat    Premature Atrial Contractions/ Palpitations  . Urinary tract infection    in past, but not currently    Family History  Problem Relation Age of Onset  . Cancer Maternal Grandmother   . Cancer Paternal Grandmother   . Hyperlipidemia Father   . Hypertension Father      Current Outpatient Medications:  .  venlafaxine XR (EFFEXOR-XR) 75 MG 24 hr capsule, Take 75 mg by mouth daily., Disp: , Rfl:  .  etonogestrel (NEXPLANON) 68 MG IMPL implant, 1 each by Subdermal route once. (Patient not taking: Reported on 06/02/2020), Disp: , Rfl:  .  venlafaxine XR (EFFEXOR-XR) 150 MG 24 hr capsule, Take 150 mg by mouth daily with breakfast. (Patient not taking: Reported on 06/02/2020), Disp: , Rfl:  .  VYVANSE 50 MG capsule, Take 50 mg by mouth daily. (Patient not taking: Reported on 06/02/2020), Disp: , Rfl:   Allergies as of 06/02/2020  . (No Known Allergies)     reports that she has never smoked. She has never used smokeless tobacco. She reports current drug use. Drug: Marijuana. She reports that she does not drink alcohol. Pediatric History  Patient Parents  . Calixto,Alex (Father)  . Balthazor,Debra (Mother)   Other Topics Concern  . 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     1. School and Family: She took a break from college. 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 She is beginning to look for a new job. Parents are divorced. Shirlene lives with her father now.  2. Activities: As above.  3. Primary Care Provider: None 4. Psychiatrist: Dr.  Ellamae Sia, phone [phone] 6. Therapist: None 7. Health insurance: She has health insurance through her father.  8. Habits: Vaping nicotine and smoking THC. She is not drinking alcohol.  REVIEW OF SYSTEMS: There are no other significant problems involving Meigan's other body systems.    Objective:  Objective  Vital Signs:  BP 116/72   Pulse 74   Wt 182 lb (82.6 kg)   BMI 29.38 kg/m     Ht Readings from Last 3 Encounters:  04/27/20  (1.676 m) (75 %, Z= 0.67)*  10/29/18 5' 6.54" (1.69 m) (82 %, Z= 0.91)*  02/13/18 5' 6.22" (1.682 m) (79 %, Z= 0.80)*   * Growth percentiles are based on CDC (Girls, 2-20 Years) data.   Wt Readings from Last 3 Encounters:  06/02/20 182 lb (82.6 kg) (95 %, Z= 1.64)*  04/27/20 177 lb (80.3 kg) (94 %, Z= 1.54)*  02/01/20 192 lb 9.6 oz (87.4 kg) (97 %, Z= 1.83)*   * Growth percentiles are based on CDC (Girls, 2-20 Years) data.   HC Readings from Last 3 Encounters:  No data found for [facility]   Body surface area is 1.96 meters squared. No height on file for this encounter. 95 %ile (Z= 1.64) based on CDC (Girls, 2-20 Years) weight-for-age data using vitals from 06/02/2020.    PHYSICAL EXAM:  Constitutional: Emberly appears healthy, but overweight/obese. Her height percentile has plateaued at about the 80%. Her weight has decreased 10 pounds to the 94.92%. Her BMI had increased to about 30 kg/m2. Ayshia was alert and bright. Her flow of speech was normal. Her affect and insight seemed quite normal. She does not appear suicidal at all.  Head: The head is normocephalic. Face: The face appears normal. There are no obvious dysmorphic features. Eyes: The eyes appear to be normally formed and spaced. Gaze is conjugate. There is no obvious arcus or proptosis. Moisture appears normal. Ears: The ears are normally placed and appear externally normal. Mouth: The oropharynx and tongue appear normal. Dentition appears to be normal for age. Oral moisture is  normal. There is no mucosal hyperpigmentation. She has no tongue tremor.  Neck: The neck appears to be visibly full today. No carotid  bruits are noted. The thyroid gland is mildly enlarged at about 20+ grams in size, but is a bit smaller. Both lobes are very mildly enlarged today. The thyroid gland is tender to palpation bilaterally today. She has no acanthosis. Lungs: The lungs are clear to auscultation. Air movement is good. Heart: Heart rate and rhythm are regular. Heart sounds S1 and S2 are normal. I did not appreciate any pathologic cardiac murmurs. Abdomen: The abdomen is larger and soft. smaller. Bowel sounds are normal. There is no obvious hepatomegaly, splenomegaly, or other mass effect. There is no abdominal tenderness.  Arms: Muscle size and bulk are normal for age. Hands: She has no tremor today. Phalangeal and metacarpophalangeal joints are normal. Palmar muscles are normal for age. Palmar skin is normal. Palmar moisture is also normal. There is no palmar hyperpigmentation.  Legs: Muscles appear normal for age. No edema is present.  Neurologic: Strength is normal for age in both the upper and lower extremities. Muscle tone is normal. Sensation to touch is normal in both legs.    LAB DATA:   Results for orders placed or performed in visit on 06/02/20 (from the past 672 hour(s))  POCT Glucose (Device for Home Use)   Collection Time: 06/02/20  9:02 AM  Result Value Ref Range   Glucose Fasting, POC 108 (A) 70 - 99 mg/dL   POC Glucose      Labs 06/02/20: Fasting CBG was 108.  Labs 05/03/20: HbA1c 5.5%; TSH 1.558; cholesterol 185, triglycerides 67, HDL 45, LDL 127  Labs 04/27/20: CMP normal, except CO2 20 (ref 22-32); CBC normal; beta hCG <5  Labs 02/01/20: HbA1c 5.6%; TSH 0.87, free T4 1.1, free  T3 3.0, thyroglobulin antibody <1, TPO antibody 2 (ref <9)  Labs 10/30/19: TSH 0.70, free T4 1.2, free T3 3.2  Labs 10/29/18: HbA1c 5.3%, CBG 131  Labs 02/13/18: HbA1c 5.4%, CBG 96; TSH  0.62, free T4 1.1, free T3 3.1, TPO antibody 1, thyroglobulin antibody <1  Labs 11/06/17:  HbA1c 5.4%; TSH 0.64, free T4 1.1, free T3 2.8  Labs 08/15/17: HbA1c 5.5%, CBG 114  Labs  05/01/17: Cholesterol 187, triglycerides 82, HDL 70, LDL 101  Labs 02/04/17: HbA1c 5.6%; TSH 1.796; cholesterol 192, triglycerides 67, HDL 66, LDL 113  Labs 02/01/17: CBC normal, CMP normal  Labs 07/26/16: HbA1c 5.4%  Labs 01/20/16: TSH 0.71, free T4 1.1, free T3 2.9  Labs 12/12/15: HbA1c 5.3%; TSH 1.70, free T4 1.0, free T3 3.0, TPO 4 (ref <9)   Labs 07/11/15: HbA1c 5.3%  Labs 07/05/15: TSH 2.21, free T4 0.9 (normal 0.80-1.40), free T3 2.1 (normal 3.0-4.7)   Labs 01/10/15: HbA1c 5.3%.  Labs 09/07/14: HbA1c 5.4%.   Labs 05/19/14: HbA1c 5.4%; TSH 1.336, free T4 0.91, free T3 3.2, anti-thyroglobulin antibody <1 (normal < 2), TPO antibody 2 (normal < 9); C-peptide 1.05    Assessment and Plan:  Assessment  ASSESSMENT:  1-4. Elevated HbA1c/History of pre-diabetes/impaired glucose tolerance/overweight:   A. Harjot's HbA1c values in August and September 2015 were definitely in the prediabetes range. At those times her BMI was at or slightly above the 85%, c/w being overweight.   B. When she was relatively overweight, the amount of cytokines produced by her "overly fat" adipose cells was excessive, she had more insulin resistance, and compensatory hyperinsulinemia. Despite the hyperinsulinemia, however, her HbA1c increased into the prediabetes range.   C. After making some changes in what she ate and how much she ate, and playing volleyball, her weight had decreased  to the 76.3% and her HbA1c had decreased to the 5.3% range in October 2016. Thereafter her weight and HbA1c fluctuated.    D. At her visit in May 2019 she had lost more weight, but her HbA1c had increased again. At her August 2019 visit she had re-gained some weight, but her HbA1c was lower. At her visit in August 2020, her weight has increased, but her HbA1c  was lower.   E. At her visit in November 2021, her weight had increased into the obesity zone. Her HbA1c was 5.6%, at the upper limit of normal glucose tolerance.   F. At today's visit in March 2022, she has lot 10 pounds. Her HbA1c in February was 5.5%. Today her fasting CBG is 108, c/w prediabetes.   5-6. Goiter/thyroiditis:   A. Jawanda has had waxing and waning of her thyroid gland size over time, c/w evolving Hashimoto's thyroiditis. She has also had episodic thyroid gland tenderness, also c/w evolving Hashimoto's thyroiditis. She has not had any more flare ups since her last visit.   B. Although there is no known family history of thyroid disease, the incidence and prevalence of Hashimoto's Dz is increasing not only in families, but in sporadic cases. If mom's fibromyalgia is indeed an autoimmune disease, as many experts believe, then the presence of autoimmunity is in the family genetics. At Yaire's age, Hashimoto's thyroiditis is the most frequent cause of goiter.  C. Jackquelyn's antibody tests for Hashimoto's disease have remained negative. The negative antibodies do not, however, rule out Hashimoto's Dz, since Hashimoto's Dz is a T-lymphocyte-mediated phenomenon and antibodies are produced by B cells. These two groups of lymphocytes often do not act in synchrony.  D. Cashmere's TFTs have remained within normal limits, but she has had fluctuations of the TFTs that were c/w episodic thyroiditis.    E. Emmerson's thyroiditis was clinically quiescent at her visit in August 2020, but was clinically active today in November 2020. Her thyroiditis has not been clinically active for several months. She is clinically euthyroid, but depressed. Her TSH in February 2022 was mid-euthyroid.  7. Acanthosis nigricans: This condition has resolved. 8. Palpitations: Resolved   9. Fatigue: Her fatigue is better 10. Major depressive disorder: She looks fairly good today, but subjectively is not doing as well as she was doing last  Fall. She has had some "fleeting" suicidal thoughts in the recent weeks, but is not actively planning to commit suicide. I asked her to tell her psychiatrist today to pass on that information. She agreed. 11. Tremor: Resolved. 12. Feet pains: Resolved.   PLAN:  1. Diagnostic: CBG today. Repeat TFTs in 3 months.   2. Therapeutic: Eat Right Diet, [facility] Diet as a source of menus to operationalize the Eat Right Diet. Plan her meals and the exercise.  3. Patient education: We discussed the issues of overweight, glucose intolerance, goiter, and thyroiditis. We again discussed the likelihood that she will become permanently hypothyroid. We also discussed the adverse effects of obesity.   4. Follow-up: 3 months    Level of Service: This visit lasted in excess of 50 minutes. More than 50% of the visit was devoted to counseling the family   Molli Knock, MD, CDE Pediatric and Adult Endocrinology

## 2020-06-27 ENCOUNTER — Ambulatory Visit (INDEPENDENT_AMBULATORY_CARE_PROVIDER_SITE_OTHER): Payer: 59 | Admitting: Psychiatry

## 2020-06-27 ENCOUNTER — Other Ambulatory Visit: Payer: Self-pay

## 2020-06-27 DIAGNOSIS — R200 Anesthesia of skin: Secondary | ICD-10-CM | POA: Diagnosis not present

## 2020-06-27 DIAGNOSIS — F332 Major depressive disorder, recurrent severe without psychotic features: Secondary | ICD-10-CM

## 2020-06-27 NOTE — Addendum Note (Signed)
Addended by: Edward Qualia on: 06/27/2020 04:27 PM   Modules accepted: Orders

## 2020-06-27 NOTE — Addendum Note (Signed)
Addended by: Mordecai Rasmussen T on: 06/27/2020 03:03 PM   Modules accepted: Orders

## 2020-06-27 NOTE — Progress Notes (Signed)
Virtual Visit via Telephone Note  I connected with Dana Thompson on 06/27/20 at  2:00 PM EDT by telephone and verified that I am speaking with the correct person using two identifiers.  Location: Patient: Home Provider: Hospital   I discussed the limitations, risks, security and privacy concerns of performing an evaluation and management service by telephone and the availability of in person appointments. I also discussed with the patient that there may be a patient responsible charge related to this service. The patient expressed understanding and agreed to proceed.   History of Present Illness: Patient was reached by telephone.  Established patient's identity.  Establish that the purpose of the meeting was a consultation for ECT treatment.  20 year old woman referred by her inpatient team as well as her outpatient psychiatrist for consideration of ECT.  Currently patient feels her mood is improved from how it was when she was in the hospital but is still up and down with occasional regular spells of sadness and even passing suicidal ideation although she has not had any intent to act on it since being in the hospital.  Her sleep is chronically impaired.  Appetite is stable.  Patient denies hallucinations or psychotic symptoms.  She states she is compliant with her medication which is currently Effexor extended release 150 mg/day and Abilify 5 mg/day.  Denies current alcohol or drug abuse.  Has had a long history for someone her age of depression with several suicide attempts and hospitalizations in childhood and now in adulthood.  Recurrent problems with severe depression often accompanied by agitation.  Multiple medication trials without clear long-term sustained improvement.  Past history of depression and also ADHD.  Several suicide attempts.  Trials of multiple antidepressants and mood stabilizing medications.  Also medications for ADHD.  Despite therapy and medication management has continued to  have spells of severe depression most recently in January and early February of this year when she was hospitalized at [facility].  Past history of TMS.  No history of ECT.  Patient lives with her parents.  Not currently working.  Family supportive.      Observations/Objective: On the phone patient was calm and appropriate.  Had a very different and more appropriate affect than the last time I spoke with her.  Lucid and clear with normal thinking and able to pay attention to conversation.  Continues to report depression as noted above but denies any current suicidal ideation.  Denies psychosis.  Patient appears lucid and able to make reasonable decisions about her treatment.   Assessment and Plan: Patient with longstanding problems with chronic depression and mood instability who has not maintained lasting stability thus far and has been referred for consideration of ECT.  We discussed the possible role of ECT in her current condition both in terms of improvement of current mood and the possibility of longer term stabilization.  It was made clear to the patient that it was not a guaranteed treatment and there was a chance she would not respond.  We discussed the details of the treatment.  Side effects including short-term memory impairment soreness tachycardia headache all discussed.  Patient expresses an awareness of this.  Under the circumstances giving her crippling ongoing symptoms and medication trials that have not been fully effective she is a good candidate for ECT.  Patient's phone number will be given to the rest of the ECT staff for contact.  We would hope that we might be able to start as early as next week  so that we would be able to get 2 weeks of treatment and prior to the break we have planned at the end of April.   Follow Up Instructions: Patient was given the telephone number of the ECT service and we will also give the patient's number to the ECT team.  Right unilateral treatment  has been discussed.  No need to change any medication    I discussed the assessment and treatment plan with the patient. The patient was provided an opportunity to ask questions and all were answered. The patient agreed with the plan and demonstrated an understanding of the instructions.   The patient was advised to call back or seek an in-person evaluation if the symptoms worsen or if the condition fails to improve as anticipated.  I provided 45 minutes of non-face-to-face time during this encounter.   Mordecai Rasmussen, MD

## 2020-06-28 ENCOUNTER — Other Ambulatory Visit: Payer: Self-pay | Admitting: Psychiatry

## 2020-06-28 DIAGNOSIS — R200 Anesthesia of skin: Secondary | ICD-10-CM

## 2020-06-29 ENCOUNTER — Ambulatory Visit: Payer: 59

## 2020-06-29 ENCOUNTER — Other Ambulatory Visit: Payer: Self-pay

## 2020-06-29 ENCOUNTER — Encounter
Admission: RE | Admit: 2020-06-29 | Discharge: 2020-06-29 | Disposition: A | Discharge status: Home / Self Care | Payer: 59 | Source: Ambulatory Visit | Attending: Psychiatry | Admitting: Psychiatry

## 2020-06-29 ENCOUNTER — Ambulatory Visit
Admission: RE | Admit: 2020-06-29 | Discharge: 2020-06-29 | Disposition: A | Discharge status: Home / Self Care | Payer: 59 | Source: Ambulatory Visit | Attending: Psychiatry | Admitting: Psychiatry

## 2020-06-29 DIAGNOSIS — R200 Anesthesia of skin: Secondary | ICD-10-CM | POA: Insufficient documentation

## 2020-06-29 DIAGNOSIS — Z20822 Contact with and (suspected) exposure to covid-19: Secondary | ICD-10-CM | POA: Insufficient documentation

## 2020-06-29 DIAGNOSIS — Z01818 Encounter for other preprocedural examination: Secondary | ICD-10-CM | POA: Insufficient documentation

## 2020-06-29 HISTORY — DX: Prediabetes: R73.03

## 2020-06-29 LAB — URINALYSIS, ROUTINE W REFLEX MICROSCOPIC
Bacteria, UA: NONE SEEN
Bilirubin Urine: NEGATIVE
Glucose, UA: NEGATIVE mg/dL
Ketones, ur: NEGATIVE mg/dL
Leukocytes,Ua: NEGATIVE
Nitrite: NEGATIVE
Protein, ur: NEGATIVE mg/dL
Specific Gravity, Urine: 1.016 (ref 1.005–1.030)
pH: 6 (ref 5.0–8.0)

## 2020-06-29 LAB — COMPREHENSIVE METABOLIC PANEL
ALT: 13 U/L (ref 0–44)
AST: 19 U/L (ref 15–41)
Albumin: 4.3 g/dL (ref 3.5–5.0)
Alkaline Phosphatase: 82 U/L (ref 38–126)
Anion gap: 8 (ref 5–15)
BUN: 16 mg/dL (ref 6–20)
CO2: 25 mmol/L (ref 22–32)
Calcium: 9.3 mg/dL (ref 8.9–10.3)
Chloride: 105 mmol/L (ref 98–111)
Creatinine, Ser: 0.73 mg/dL (ref 0.44–1.00)
GFR, Estimated: >60 mL/min (ref >60)
Glucose, Bld: 100 mg/dL — ABNORMAL HIGH (ref 70–99)
Potassium: 4.4 mmol/L (ref 3.5–5.1)
Sodium: 138 mmol/L (ref 135–145)
Total Bilirubin: 0.7 mg/dL (ref 0.3–1.2)
Total Protein: 7.8 g/dL (ref 6.5–8.1)

## 2020-06-29 LAB — CBC
HCT: 42.7 % (ref 36.0–46.0)
Hemoglobin: 14.2 g/dL (ref 12.0–15.0)
MCH: 29.2 pg (ref 26.0–34.0)
MCHC: 33.3 g/dL (ref 30.0–36.0)
MCV: 87.9 fL (ref 80.0–100.0)
Platelets: 352 10*3/uL (ref 150–400)
RBC: 4.86 MIL/uL (ref 3.87–5.11)
RDW: 14 % (ref 11.5–15.5)
WBC: 7.3 10*3/uL (ref 4.0–10.5)
nRBC: 0 % (ref 0.0–0.2)

## 2020-06-29 LAB — SARS CORONAVIRUS 2 (TAT 6-24 HRS): SARS Coronavirus 2: NEGATIVE

## 2020-07-04 ENCOUNTER — Other Ambulatory Visit: Payer: 59 | Attending: Psychiatry

## 2020-07-05 ENCOUNTER — Other Ambulatory Visit: Payer: Self-pay | Admitting: Psychiatry

## 2020-07-06 ENCOUNTER — Encounter
Admission: RE | Admit: 2020-07-06 | Discharge: 2020-07-06 | Disposition: A | Discharge status: Home / Self Care | Payer: 59 | Source: Ambulatory Visit | Attending: Psychiatry | Admitting: Psychiatry

## 2020-07-06 ENCOUNTER — Other Ambulatory Visit: Payer: Self-pay

## 2020-07-06 ENCOUNTER — Encounter: Payer: Self-pay | Admitting: Certified Registered Nurse Anesthetist

## 2020-07-06 DIAGNOSIS — F339 Major depressive disorder, recurrent, unspecified: Secondary | ICD-10-CM | POA: Diagnosis present

## 2020-07-06 DIAGNOSIS — F332 Major depressive disorder, recurrent severe without psychotic features: Secondary | ICD-10-CM | POA: Diagnosis not present

## 2020-07-06 LAB — POCT PREGNANCY, URINE: Preg Test, Ur: NEGATIVE

## 2020-07-06 MED ORDER — HYDROXYZINE HCL 50 MG PO TABS
50.0000 mg | ORAL_TABLET | ORAL | Status: AC
  Administered 2020-07-06: 50 mg via ORAL
  Filled 2020-07-06: qty 1

## 2020-07-06 MED ORDER — SODIUM CHLORIDE 0.9 % IV SOLN: INTRAVENOUS | Status: DC | PRN

## 2020-07-06 MED ORDER — MIDAZOLAM HCL 2 MG/2ML IJ SOLN
INTRAMUSCULAR | Status: DC | PRN
  Administered 2020-07-06: 2 mg via INTRAVENOUS

## 2020-07-06 MED ORDER — SUCCINYLCHOLINE CHLORIDE 200 MG/10ML IV SOSY
PREFILLED_SYRINGE | INTRAVENOUS | Status: DC | PRN
  Administered 2020-07-06: 100 mg via INTRAVENOUS

## 2020-07-06 MED ORDER — GLYCOPYRROLATE 0.2 MG/ML IJ SOLN
INTRAMUSCULAR | Status: AC
  Administered 2020-07-06: 0.1 mg via INTRAVENOUS
  Filled 2020-07-06: qty 1

## 2020-07-06 MED ORDER — METHOHEXITAL SODIUM 100 MG/10ML IV SOSY
PREFILLED_SYRINGE | INTRAVENOUS | Status: DC | PRN
  Administered 2020-07-06: 80 mg via INTRAVENOUS

## 2020-07-06 MED ORDER — GLYCOPYRROLATE 0.2 MG/ML IJ SOLN: 0.1000 mg | Freq: Once | INTRAMUSCULAR | Status: AC

## 2020-07-06 MED ORDER — SODIUM CHLORIDE 0.9 % IV SOLN
500.0000 mL | Freq: Once | INTRAVENOUS | Status: AC
  Administered 2020-07-06: 500 mL via INTRAVENOUS

## 2020-07-06 MED ORDER — METHOHEXITAL SODIUM 0.5 G IJ SOLR
INTRAMUSCULAR | Status: AC
  Filled 2020-07-06: qty 500

## 2020-07-06 MED ORDER — MIDAZOLAM HCL 2 MG/2ML IJ SOLN: 2.0000 mg | Freq: Once | INTRAMUSCULAR | Status: DC

## 2020-07-06 MED ORDER — MIDAZOLAM HCL 2 MG/2ML IJ SOLN
INTRAMUSCULAR | Status: AC
  Filled 2020-07-06: qty 2

## 2020-07-06 NOTE — Procedures (Signed)
ECT SERVICES Physician's Interval Evaluation & Treatment Note  Patient Identification: Dana Thompson MRN:  [id number] Date of Evaluation:  07/06/2020 TX #: 1  MADRS:   MMSE:   P.E. Findings:  No change to physical findings.  Unremarkable.  Heart and lungs normal.  Neurologic normal.  Psychiatric Interval Note:  Cooperative intact not evidently psychotic.  Denies suicidal ideation.  Able to consent and cooperate with treatment  Subjective:  Patient is a 20 y.o. female seen for evaluation for Electroconvulsive Therapy. Chronic severe depression  Treatment Summary:   [x]   Right Unilateral             []  Bilateral   % Energy : 0.3 ms 20%   Impedance: 1600 ohms  Seizure Energy Index: 14,356 V squared  Postictal Suppression Index: 48%  Seizure Concordance Index: 97%  Medications  Pre Shock: Robinul 0.1 mg Brevital 80 mg succinylcholine 100 mg Vistaril 50 mg oral given prior to beginning treatment  Post Shock: Versed 2 mg  Seizure Duration: 37 seconds EMG 134 seconds EEG   Comments: Tolerated well no complication.  Next treatment on Friday.  Lungs:  [x]   Clear to auscultation               []  Other:   Heart:    [x]   Regular rhythm             []  irregular rhythm    [x]   Previous H&P reviewed, patient examined and there are NO CHANGES                 []   Previous H&P reviewed, patient examined and there are changes noted.   , MD 4/13/20225:24 PM

## 2020-07-06 NOTE — Anesthesia Postprocedure Evaluation (Signed)
Anesthesia Post Note  Patient: Dana Thompson  Procedure(s) Performed: ECT TX  Patient location during evaluation: PACU Anesthesia Type: General Level of consciousness: awake and alert Pain management: pain level controlled Vital Signs Assessment: post-procedure vital signs reviewed and stable Respiratory status: spontaneous breathing, nonlabored ventilation and respiratory function stable Cardiovascular status: blood pressure returned to baseline and stable Postop Assessment: no signs of nausea or vomiting Anesthetic complications: no   No complications documented.   Last Vitals:  Vitals:   07/06/20 1310 07/06/20 1320  BP: 105/70 115/74  Pulse: 82 81  Resp: 18 20  Temp:    SpO2: 100% 97%    Last Pain:  Vitals:   07/06/20 1320  TempSrc:   PainSc: Asleep                 Gurjit Loconte

## 2020-07-06 NOTE — Anesthesia Preprocedure Evaluation (Signed)
Anesthesia Evaluation  Patient identified by MRN, date of birth, ID band Patient awake    Reviewed: Allergy & Precautions, NPO status , Patient's Chart, lab work & pertinent test results  History of Anesthesia Complications Negative for: history of anesthetic complications  Airway Mallampati: III  TM Distance: >3 FB Neck ROM: Full    Dental no notable dental hx.    Pulmonary neg pulmonary ROS, neg sleep apnea, neg COPD,    breath sounds clear to auscultation- rhonchi (-) wheezing      Cardiovascular Exercise Tolerance: Good (-) hypertension(-) CAD and (-) Past MI  Rhythm:Regular Rate:Normal - Systolic murmurs and - Diastolic murmurs    Neuro/Psych PSYCHIATRIC DISORDERS Anxiety Depression negative neurological ROS     GI/Hepatic negative GI ROS, Neg liver ROS,   Endo/Other  negative endocrine ROSneg diabetes  Renal/GU negative Renal ROS     Musculoskeletal negative musculoskeletal ROS (+)   Abdominal (+) - obese,   Peds  Hematology negative hematology ROS (+)   Anesthesia Other Findings   Reproductive/Obstetrics                             Anesthesia Physical Anesthesia Plan  ASA: II  Anesthesia Plan: General   Post-op Pain Management:    Induction: Intravenous  PONV Risk Score and Plan: 2 and Ondansetron  Airway Management Planned: Mask  Additional Equipment:   Intra-op Plan:   Post-operative Plan:   Informed Consent: I have reviewed the patients History and Physical, chart, labs and discussed the procedure including the risks, benefits and alternatives for the proposed anesthesia with the patient or authorized representative who has indicated his/her understanding and acceptance.     Dental advisory given  Plan Discussed with: CRNA and Anesthesiologist  Anesthesia Plan Comments:         Anesthesia Quick Evaluation

## 2020-07-06 NOTE — Anesthesia Procedure Notes (Signed)
Procedure Name: General with mask airway Date/Time: 07/06/2020 12:40 PM Performed by: Hezzie Bump, CRNA Pre-anesthesia Checklist: Patient identified, Emergency Drugs available, Suction available and Patient being monitored Patient Re-evaluated:Patient Re-evaluated prior to induction Oxygen Delivery Method: Circle system utilized Preoxygenation: Pre-oxygenation with 100% oxygen Induction Type: IV induction Ventilation: Mask ventilation without difficulty and Mask ventilation throughout procedure Airway Equipment and Method: Bite block Placement Confirmation: positive ETCO2 Dental Injury: Teeth and Oropharynx as per pre-operative assessment

## 2020-07-06 NOTE — Transfer of Care (Signed)
Immediate Anesthesia Transfer of Care Note  Patient: Dana Thompson  Procedure(s) Performed: ECT TX  Patient Location: PACU  Anesthesia Type:General  Level of Consciousness: drowsy  Airway & Oxygen Therapy: Patient Spontanous Breathing and Patient connected to face mask oxygen  Post-op Assessment: Report given to RN and Post -op Vital signs reviewed and stable  Post vital signs: Reviewed and stable  Last Vitals:  Vitals Value Taken Time  BP    Temp    Pulse 79 07/06/20 1255  Resp 21 07/06/20 1255  SpO2 99 % 07/06/20 1255  Vitals shown include unvalidated device data.  Last Pain:  Vitals:   07/06/20 1118  TempSrc:   PainSc: 0-No pain         Complications: No complications documented.

## 2020-07-06 NOTE — H&P (Signed)
Dana Thompson is an 20 y.o. female.   Chief Complaint: Anxious today as part of ongoing recurrent severe depression HPI: History of recurrent severe depression starting ECT treatment now  Past Medical History:  Diagnosis Date  . ADHD (attention deficit hyperactivity disorder)   . Anxiety   . Attention deficit disorder   . Depression   . Irregular heartbeat    Premature Atrial Contractions/ Palpitations  . Pre-diabetes   . Urinary tract infection    in past, but not currently    History reviewed. No pertinent surgical history.  Family History  Problem Relation Age of Onset  . Cancer Maternal Grandmother   . Cancer Paternal Grandmother   . Hyperlipidemia Father   . Hypertension Father    Social History:  reports that she has never smoked. She has never used smokeless tobacco. She reports current alcohol use of about 1.0 standard drink of alcohol per week. She reports current drug use. Frequency: 7.00 times per week. Drug: Marijuana.  Allergies: No Known Allergies  (Not in a hospital admission)   Results for orders placed or performed during the hospital encounter of 07/06/20 (from the past 48 hour(s))  Pregnancy, urine POC     Status: None   Collection Time: 07/06/20 11:12 AM  Result Value Ref Range   Preg Test, Ur NEGATIVE NEGATIVE    Comment:        THE SENSITIVITY OF THIS METHODOLOGY IS >24 mIU/mL    No results found.  Review of Systems  Constitutional: Negative.   HENT: Negative.   Eyes: Negative.   Respiratory: Negative.   Cardiovascular: Negative.   Gastrointestinal: Negative.   Musculoskeletal: Negative.   Skin: Negative.   Neurological: Negative.   Psychiatric/Behavioral: Positive for dysphoric mood. Negative for suicidal ideas.    Blood pressure 108/75, pulse 92, temperature 97.6 F (36.4 C), temperature source Oral, resp. rate 18, height 5\' 6"  (1.676 m), weight 83.9 kg, last menstrual period 06/19/2020, SpO2 97 %. Physical Exam Vitals and nursing  note reviewed.  Constitutional:      Appearance: She is well-developed.  HENT:     Head: Normocephalic and atraumatic.  Eyes:     Conjunctiva/sclera: Conjunctivae normal.     Pupils: Pupils are equal, round, and reactive to light.  Cardiovascular:     Heart sounds: Normal heart sounds.  Pulmonary:     Effort: Pulmonary effort is normal.  Abdominal:     Palpations: Abdomen is soft.  Musculoskeletal:        General: Normal range of motion.     Cervical back: Normal range of motion.  Skin:    General: Skin is warm and dry.  Neurological:     General: No focal deficit present.     Mental Status: She is alert.  Psychiatric:        Attention and Perception: Attention normal.        Mood and Affect: Mood is anxious.        Speech: Speech normal.        Behavior: Behavior is cooperative.        Thought Content: Thought content is not paranoid. Thought content does not include homicidal or suicidal ideation.        Cognition and Memory: Cognition normal.        Judgment: Judgment normal.      Assessment/Plan We are beginning right unilateral treatment today to try and improve her chronic symptoms of depression  06/21/2020, MD 07/06/2020, 5:23 PM

## 2020-07-08 ENCOUNTER — Encounter
Admission: RE | Admit: 2020-07-08 | Discharge: 2020-07-08 | Disposition: A | Discharge status: Home / Self Care | Payer: 59 | Source: Ambulatory Visit | Attending: Psychiatry | Admitting: Psychiatry

## 2020-07-08 ENCOUNTER — Other Ambulatory Visit: Payer: Self-pay | Admitting: Psychiatry

## 2020-07-08 ENCOUNTER — Encounter: Payer: Self-pay | Admitting: Certified Registered"

## 2020-07-08 ENCOUNTER — Ambulatory Visit: Payer: Self-pay | Admitting: Certified Registered"

## 2020-07-08 ENCOUNTER — Other Ambulatory Visit: Payer: Self-pay

## 2020-07-08 DIAGNOSIS — F32A Depression, unspecified: Secondary | ICD-10-CM | POA: Insufficient documentation

## 2020-07-08 MED ORDER — METHOHEXITAL SODIUM 100 MG/10ML IV SOSY
PREFILLED_SYRINGE | INTRAVENOUS | Status: DC | PRN
  Administered 2020-07-08: 80 mg via INTRAVENOUS

## 2020-07-08 MED ORDER — GLYCOPYRROLATE 0.2 MG/ML IJ SOLN: 0.1000 mg | Freq: Once | INTRAMUSCULAR | Status: AC

## 2020-07-08 MED ORDER — ONDANSETRON HCL 4 MG/2ML IJ SOLN
INTRAMUSCULAR | Status: AC
  Administered 2020-07-08: 4 mg
  Filled 2020-07-08: qty 2

## 2020-07-08 MED ORDER — GLYCOPYRROLATE 0.2 MG/ML IJ SOLN
INTRAMUSCULAR | Status: AC
  Administered 2020-07-08: 0.1 mg via INTRAVENOUS
  Filled 2020-07-08: qty 1

## 2020-07-08 MED ORDER — MIDAZOLAM HCL 2 MG/2ML IJ SOLN
INTRAMUSCULAR | Status: AC
  Filled 2020-07-08: qty 2

## 2020-07-08 MED ORDER — KETOROLAC TROMETHAMINE 30 MG/ML IJ SOLN
INTRAMUSCULAR | Status: AC
  Administered 2020-07-08: 30 mg via INTRAVENOUS
  Filled 2020-07-08: qty 1

## 2020-07-08 MED ORDER — SODIUM CHLORIDE 0.9 % IV SOLN: 500.0000 mL | Freq: Once | INTRAVENOUS | Status: AC

## 2020-07-08 MED ORDER — KETOROLAC TROMETHAMINE 30 MG/ML IJ SOLN: 30.0000 mg | Freq: Once | INTRAMUSCULAR | Status: AC

## 2020-07-08 MED ORDER — MIDAZOLAM HCL 2 MG/2ML IJ SOLN
2.0000 mg | Freq: Once | INTRAMUSCULAR | Status: AC
  Administered 2020-07-08: 2 mg via INTRAVENOUS

## 2020-07-08 MED ORDER — METHOHEXITAL SODIUM 0.5 G IJ SOLR
INTRAMUSCULAR | Status: AC
  Filled 2020-07-08: qty 500

## 2020-07-08 MED ORDER — SUCCINYLCHOLINE CHLORIDE 20 MG/ML IJ SOLN
INTRAMUSCULAR | Status: DC | PRN
  Administered 2020-07-08: 100 mg via INTRAVENOUS

## 2020-07-08 NOTE — Anesthesia Postprocedure Evaluation (Signed)
Anesthesia Post Note  Patient: Nevayah Faust  Procedure(s) Performed: ECT TX  Patient location during evaluation: PACU Anesthesia Type: General Level of consciousness: awake and alert Pain management: pain level controlled Vital Signs Assessment: post-procedure vital signs reviewed and stable Respiratory status: spontaneous breathing, nonlabored ventilation and respiratory function stable Cardiovascular status: blood pressure returned to baseline and stable Postop Assessment: no apparent nausea or vomiting Anesthetic complications: no   No complications documented.   Last Vitals:  Vitals:   07/08/20 1310 07/08/20 1315  BP: 114/68   Pulse: 95 84  Resp: 16 19  Temp:    SpO2: 91% 91%    Last Pain:  Vitals:   07/08/20 1310  TempSrc:   PainSc: 0-No pain                 Aurelio Brash Benno Brensinger

## 2020-07-08 NOTE — Anesthesia Preprocedure Evaluation (Signed)
Anesthesia Evaluation  Patient identified by MRN, date of birth, ID band Patient awake    Reviewed: Allergy & Precautions, H&P , NPO status , Patient's Chart, lab work & pertinent test results  History of Anesthesia Complications Negative for: history of anesthetic complications  Airway Mallampati: II  TM Distance: >3 FB Neck ROM: full    Dental  (+) Teeth Intact   Pulmonary neg pulmonary ROS, neg sleep apnea, neg COPD,    breath sounds clear to auscultation       Cardiovascular (-) angina(-) Past MI and (-) Cardiac Stents negative cardio ROS  (-) dysrhythmias  Rhythm:regular Rate:Normal     Neuro/Psych PSYCHIATRIC DISORDERS Anxiety Depression negative neurological ROS     GI/Hepatic negative GI ROS, Neg liver ROS,   Endo/Other  negative endocrine ROS  Renal/GU negative Renal ROS  negative genitourinary   Musculoskeletal   Abdominal   Peds  Hematology negative hematology ROS (+)   Anesthesia Other Findings Past Medical History: No date: ADHD (attention deficit hyperactivity disorder) No date: Anxiety No date: Attention deficit disorder No date: Depression No date: Irregular heartbeat     Comment:  Premature Atrial Contractions/ Palpitations No date: Pre-diabetes No date: Urinary tract infection     Comment:  in past, but not currently  History reviewed. No pertinent surgical history.  BMI    Body Mass Index: 29.86 kg/m      Reproductive/Obstetrics negative OB ROS                             Anesthesia Physical Anesthesia Plan  ASA: II  Anesthesia Plan: General   Post-op Pain Management:    Induction:   PONV Risk Score and Plan:   Airway Management Planned: Mask  Additional Equipment:   Intra-op Plan:   Post-operative Plan:   Informed Consent: I have reviewed the patients History and Physical, chart, labs and discussed the procedure including the risks,  benefits and alternatives for the proposed anesthesia with the patient or authorized representative who has indicated his/her understanding and acceptance.     Dental Advisory Given  Plan Discussed with: Anesthesiologist, CRNA and Surgeon  Anesthesia Plan Comments:         Anesthesia Quick Evaluation

## 2020-07-08 NOTE — Transfer of Care (Signed)
Immediate Anesthesia Transfer of Care Note  Patient: Dana Thompson  Procedure(s) Performed: ECT TX  Patient Location: PACU  Anesthesia Type:General  Level of Consciousness: drowsy  Airway & Oxygen Therapy: Patient Spontanous Breathing and Patient connected to nasal cannula oxygen  Post-op Assessment: Report given to RN  Post vital signs: stable  Last Vitals:  Vitals Value Taken Time  BP    Temp    Pulse 92 07/08/20 1242  Resp 20 07/08/20 1242  SpO2 94 % 07/08/20 1242  Vitals shown include unvalidated device data.  Last Pain:  Vitals:   07/08/20 1056  TempSrc:   PainSc: 0-No pain         Complications: No complications documented.

## 2020-07-11 ENCOUNTER — Encounter (HOSPITAL_COMMUNITY)
Admission: RE | Admit: 2020-07-11 | Discharge: 2020-07-11 | Disposition: A | Discharge status: Home / Self Care | Payer: 59 | Source: Ambulatory Visit | Attending: Psychiatry | Admitting: Psychiatry

## 2020-07-11 ENCOUNTER — Encounter: Payer: Self-pay | Admitting: Registered Nurse

## 2020-07-11 ENCOUNTER — Other Ambulatory Visit: Payer: Self-pay

## 2020-07-11 ENCOUNTER — Other Ambulatory Visit: Payer: Self-pay | Admitting: Psychiatry

## 2020-07-11 DIAGNOSIS — F332 Major depressive disorder, recurrent severe without psychotic features: Secondary | ICD-10-CM

## 2020-07-11 DIAGNOSIS — F339 Major depressive disorder, recurrent, unspecified: Secondary | ICD-10-CM | POA: Diagnosis not present

## 2020-07-11 MED ORDER — KETOROLAC TROMETHAMINE 30 MG/ML IJ SOLN: 30.0000 mg | Freq: Once | INTRAMUSCULAR | Status: AC

## 2020-07-11 MED ORDER — ONDANSETRON HCL 4 MG/2ML IJ SOLN
INTRAMUSCULAR | Status: AC
  Administered 2020-07-11: 4 mg via INTRAVENOUS
  Filled 2020-07-11: qty 2

## 2020-07-11 MED ORDER — MIDAZOLAM HCL 2 MG/2ML IJ SOLN
2.0000 mg | Freq: Once | INTRAMUSCULAR | Status: AC
  Administered 2020-07-11: 2 mg via INTRAVENOUS

## 2020-07-11 MED ORDER — KETOROLAC TROMETHAMINE 30 MG/ML IJ SOLN
INTRAMUSCULAR | Status: AC
  Administered 2020-07-11: 30 mg via INTRAVENOUS
  Filled 2020-07-11: qty 1

## 2020-07-11 MED ORDER — ONDANSETRON HCL 4 MG/2ML IJ SOLN: 4.0000 mg | Freq: Once | INTRAMUSCULAR | Status: AC

## 2020-07-11 MED ORDER — MIDAZOLAM HCL 2 MG/2ML IJ SOLN
INTRAMUSCULAR | Status: AC
  Filled 2020-07-11: qty 2

## 2020-07-11 MED ORDER — SODIUM CHLORIDE 0.9 % IV SOLN
500.0000 mL | Freq: Once | INTRAVENOUS | Status: AC
  Administered 2020-07-11: 500 mL via INTRAVENOUS

## 2020-07-11 MED ORDER — SUCCINYLCHOLINE CHLORIDE 20 MG/ML IJ SOLN
INTRAMUSCULAR | Status: DC | PRN
  Administered 2020-07-11: 100 mg via INTRAVENOUS

## 2020-07-11 MED ORDER — GLYCOPYRROLATE 0.2 MG/ML IJ SOLN
INTRAMUSCULAR | Status: AC
  Administered 2020-07-11: 0.1 mg via INTRAVENOUS
  Filled 2020-07-11: qty 1

## 2020-07-11 MED ORDER — METHOHEXITAL SODIUM 100 MG/10ML IV SOSY
PREFILLED_SYRINGE | INTRAVENOUS | Status: DC | PRN
  Administered 2020-07-11: 80 mg via INTRAVENOUS

## 2020-07-11 MED ORDER — SODIUM CHLORIDE 0.9 % IV SOLN: INTRAVENOUS | Status: DC | PRN

## 2020-07-11 MED ORDER — GLYCOPYRROLATE 0.2 MG/ML IJ SOLN: 0.1000 mg | Freq: Once | INTRAMUSCULAR | Status: AC

## 2020-07-11 NOTE — Anesthesia Preprocedure Evaluation (Signed)
Anesthesia Evaluation  Patient identified by MRN, date of birth, ID band Patient awake    Reviewed: Allergy & Precautions, H&P , NPO status , Patient's Chart, lab work & pertinent test results  History of Anesthesia Complications Negative for: history of anesthetic complications  Airway Mallampati: II  TM Distance: >3 FB Neck ROM: full    Dental  (+) Teeth Intact   Pulmonary neg pulmonary ROS, neg sleep apnea, neg COPD,    breath sounds clear to auscultation       Cardiovascular (-) angina(-) Past MI and (-) Cardiac Stents negative cardio ROS  (-) dysrhythmias  Rhythm:regular Rate:Normal     Neuro/Psych PSYCHIATRIC DISORDERS Anxiety Depression negative neurological ROS     GI/Hepatic negative GI ROS, Neg liver ROS,   Endo/Other  negative endocrine ROS  Renal/GU negative Renal ROS  negative genitourinary   Musculoskeletal   Abdominal   Peds  Hematology negative hematology ROS (+)   Anesthesia Other Findings Past Medical History: No date: ADHD (attention deficit hyperactivity disorder) No date: Anxiety No date: Attention deficit disorder No date: Depression No date: Irregular heartbeat     Comment:  Premature Atrial Contractions/ Palpitations No date: Pre-diabetes No date: Urinary tract infection     Comment:  in past, but not currently  History reviewed. No pertinent surgical history.  BMI    Body Mass Index: 29.86 kg/m      Reproductive/Obstetrics negative OB ROS                             Anesthesia Physical  Anesthesia Plan  ASA: II  Anesthesia Plan: General   Post-op Pain Management:    Induction: Intravenous  PONV Risk Score and Plan: TIVA  Airway Management Planned: Mask  Additional Equipment:   Intra-op Plan:   Post-operative Plan:   Informed Consent: I have reviewed the patients History and Physical, chart, labs and discussed the procedure including  the risks, benefits and alternatives for the proposed anesthesia with the patient or authorized representative who has indicated his/her understanding and acceptance.     Dental Advisory Given  Plan Discussed with: Anesthesiologist, CRNA and Surgeon  Anesthesia Plan Comments:         Anesthesia Quick Evaluation

## 2020-07-11 NOTE — Transfer of Care (Signed)
Immediate Anesthesia Transfer of Care Note  Patient: Dana Thompson  Procedure(s) Performed: ECT TX  Patient Location: PACU  Anesthesia Type:General  Level of Consciousness: drowsy  Airway & Oxygen Therapy: Patient Spontanous Breathing and Patient connected to face mask oxygen  Post-op Assessment: Report given to RN and Post -op Vital signs reviewed and stable  Post vital signs: Reviewed and stable  Last Vitals:  Vitals Value Taken Time  BP 114/65   Temp    Pulse 100 07/11/20 1231  Resp 18   SpO2 98 % 07/11/20 1231  Vitals shown include unvalidated device data.  Last Pain:  Vitals:   07/11/20 1113  TempSrc:   PainSc: 0-No pain         Complications: No complications documented.

## 2020-07-11 NOTE — Procedures (Signed)
ECT SERVICES Physician's Interval Evaluation & Treatment Note  Patient Identification: Shade Rivenbark MRN:  [id number] Date of Evaluation:  07/11/2020 TX #: 3  MADRS:   MMSE:   P.E. Findings:  No change to physical exam  Psychiatric Interval Note:  Continues to complain of depression without psychosis or suicidal ideation  Subjective:  Patient is a 20 y.o. female seen for evaluation for Electroconvulsive Therapy. Does not feel any change  Treatment Summary:   [x]   Right Unilateral             []  Bilateral   % Energy : 0.3 ms 20%   Impedance: 1970 ohms  Seizure Energy Index: 14,555 V squared  Postictal Suppression Index: 34%  Seizure Concordance Index: 97%  Medications  Pre Shock: Zofran 4 mg Robinul 0.1 mg Toradol 30 mg Brevital 80 mg succinylcholine 150 mg  Post Shock: Versed 2 mg  Seizure Duration: 35 seconds EMG 61 seconds EEG   Comments: Follow-up with continued index course next treatment Wednesday  Lungs:  [x]   Clear to auscultation               []  Other:   Heart:    [x]   Regular rhythm             []  irregular rhythm    [x]   Previous H&P reviewed, patient examined and there are NO CHANGES                 []   Previous H&P reviewed, patient examined and there are changes noted.   , MD 4/18/20225:42 PM

## 2020-07-11 NOTE — Anesthesia Postprocedure Evaluation (Signed)
Anesthesia Post Note  Patient: Dana Thompson  Procedure(s) Performed: ECT TX  Patient location during evaluation: PACU Anesthesia Type: General Level of consciousness: awake and alert Pain management: pain level controlled Vital Signs Assessment: post-procedure vital signs reviewed and stable Respiratory status: spontaneous breathing, nonlabored ventilation, respiratory function stable and patient connected to nasal cannula oxygen Cardiovascular status: blood pressure returned to baseline and stable Postop Assessment: no apparent nausea or vomiting Anesthetic complications: no   No complications documented.   Last Vitals:  Vitals:   07/11/20 1300 07/11/20 1324  BP: 100/69 102/72  Pulse: 78 78  Resp: 20 18  Temp: 36.4 C 36.4 C  SpO2: 96%     Last Pain:  Vitals:   07/11/20 1324  TempSrc: Oral  PainSc: 0-No pain                 Lenard Simmer

## 2020-07-11 NOTE — H&P (Signed)
Dana Thompson is an 20 y.o. female.   Chief Complaint: Continues to complain of ongoing depression without suicidal ideation or psychosis HPI: History of recurrent depression with attempt to treat with ECT  Past Medical History:  Diagnosis Date  . ADHD (attention deficit hyperactivity disorder)   . Anxiety   . Attention deficit disorder   . Depression   . Irregular heartbeat    Premature Atrial Contractions/ Palpitations  . Pre-diabetes   . Urinary tract infection    in past, but not currently    History reviewed. No pertinent surgical history.  Family History  Problem Relation Age of Onset  . Cancer Maternal Grandmother   . Cancer Paternal Grandmother   . Hyperlipidemia Father   . Hypertension Father    Social History:  reports that she has never smoked. She has never used smokeless tobacco. She reports current alcohol use of about 1.0 standard drink of alcohol per week. She reports current drug use. Frequency: 7.00 times per week. Drug: Marijuana.  Allergies: No Known Allergies  (Not in a hospital admission)   No results found for this or any previous visit (from the past 48 hour(s)). No results found.  Review of Systems  Constitutional: Negative.   HENT: Negative.   Eyes: Negative.   Respiratory: Negative.   Cardiovascular: Negative.   Gastrointestinal: Negative.   Musculoskeletal: Negative.   Skin: Negative.   Neurological: Negative.   Psychiatric/Behavioral: Positive for dysphoric mood.    Blood pressure 102/72, pulse 78, temperature 97.6 F (36.4 C), temperature source Oral, resp. rate 18, height 5\' 6"  (1.676 m), weight 83.9 kg, last menstrual period 06/19/2020, SpO2 96 %. Physical Exam Vitals and nursing note reviewed.  Constitutional:      Appearance: She is well-developed.  HENT:     Head: Normocephalic and atraumatic.  Eyes:     Conjunctiva/sclera: Conjunctivae normal.     Pupils: Pupils are equal, round, and reactive to light.  Cardiovascular:      Heart sounds: Normal heart sounds.  Pulmonary:     Effort: Pulmonary effort is normal.  Abdominal:     Palpations: Abdomen is soft.  Musculoskeletal:        General: Normal range of motion.     Cervical back: Normal range of motion.  Skin:    General: Skin is warm and dry.  Neurological:     Mental Status: She is alert.  Psychiatric:        Attention and Perception: Attention normal.        Mood and Affect: Mood is anxious and depressed.        Speech: Speech is delayed.        Behavior: Behavior is slowed.        Thought Content: Thought content does not include suicidal ideation.        Cognition and Memory: Cognition normal.        Judgment: Judgment normal.      Assessment/Plan Continue current index course with ongoing assessment of improvement  06/21/2020, MD 07/11/2020, 5:41 PM

## 2020-07-11 NOTE — Anesthesia Procedure Notes (Signed)
Procedure Name: General with mask airway Date/Time: 07/11/2020 12:18 PM Performed by: Joanette Gula, Winston Misner, CRNA Pre-anesthesia Checklist: Patient identified, Emergency Drugs available, Suction available, Patient being monitored and Timeout performed Patient Re-evaluated:Patient Re-evaluated prior to induction Oxygen Delivery Method: Circle system utilized Preoxygenation: Pre-oxygenation with 100% oxygen Induction Type: IV induction Ventilation: Mask ventilation throughout procedure

## 2020-07-12 ENCOUNTER — Other Ambulatory Visit: Payer: Self-pay | Admitting: Psychiatry

## 2020-07-13 ENCOUNTER — Encounter (HOSPITAL_COMMUNITY)
Admission: RE | Admit: 2020-07-13 | Discharge: 2020-07-13 | Disposition: A | Discharge status: Home / Self Care | Payer: 59 | Source: Ambulatory Visit | Attending: Psychiatry | Admitting: Psychiatry

## 2020-07-13 ENCOUNTER — Encounter: Payer: Self-pay | Admitting: Certified Registered Nurse Anesthetist

## 2020-07-13 ENCOUNTER — Other Ambulatory Visit: Payer: Self-pay

## 2020-07-13 DIAGNOSIS — F339 Major depressive disorder, recurrent, unspecified: Secondary | ICD-10-CM | POA: Diagnosis not present

## 2020-07-13 DIAGNOSIS — F332 Major depressive disorder, recurrent severe without psychotic features: Secondary | ICD-10-CM | POA: Diagnosis not present

## 2020-07-13 MED ORDER — GLYCOPYRROLATE 0.2 MG/ML IJ SOLN
INTRAMUSCULAR | Status: AC
  Administered 2020-07-13: 0.2 mg via INTRAVENOUS
  Filled 2020-07-13: qty 1

## 2020-07-13 MED ORDER — KETOROLAC TROMETHAMINE 30 MG/ML IJ SOLN: 30.0000 mg | Freq: Once | INTRAMUSCULAR | Status: AC

## 2020-07-13 MED ORDER — METHOHEXITAL SODIUM 0.5 G IJ SOLR
INTRAMUSCULAR | Status: AC
  Filled 2020-07-13: qty 500

## 2020-07-13 MED ORDER — GLYCOPYRROLATE 0.2 MG/ML IJ SOLN: 0.2000 mg | Freq: Once | INTRAMUSCULAR | Status: AC

## 2020-07-13 MED ORDER — SODIUM CHLORIDE 0.9 % IV SOLN
500.0000 mL | Freq: Once | INTRAVENOUS | Status: AC
  Administered 2020-07-13: 500 mL via INTRAVENOUS

## 2020-07-13 MED ORDER — MIDAZOLAM HCL 2 MG/2ML IJ SOLN: 2.0000 mg | Freq: Once | INTRAMUSCULAR | Status: DC

## 2020-07-13 MED ORDER — ONDANSETRON HCL 4 MG/2ML IJ SOLN: 4.0000 mg | Freq: Once | INTRAMUSCULAR | Status: DC | PRN

## 2020-07-13 MED ORDER — ONDANSETRON HCL 4 MG/2ML IJ SOLN
INTRAMUSCULAR | Status: AC
  Administered 2020-07-13: 4 mg via INTRAVENOUS
  Filled 2020-07-13: qty 2

## 2020-07-13 MED ORDER — SUCCINYLCHOLINE CHLORIDE 20 MG/ML IJ SOLN
INTRAMUSCULAR | Status: DC | PRN
  Administered 2020-07-13: 100 mg via INTRAVENOUS

## 2020-07-13 MED ORDER — ONDANSETRON HCL 4 MG/2ML IJ SOLN: 4.0000 mg | Freq: Once | INTRAMUSCULAR | Status: AC

## 2020-07-13 MED ORDER — METHOHEXITAL SODIUM 100 MG/10ML IV SOSY
PREFILLED_SYRINGE | INTRAVENOUS | Status: DC | PRN
  Administered 2020-07-13: 80 mg via INTRAVENOUS

## 2020-07-13 MED ORDER — MIDAZOLAM HCL 2 MG/2ML IJ SOLN
INTRAMUSCULAR | Status: AC
  Filled 2020-07-13: qty 2

## 2020-07-13 MED ORDER — SODIUM CHLORIDE 0.9 % IV SOLN: INTRAVENOUS | Status: DC | PRN

## 2020-07-13 MED ORDER — KETOROLAC TROMETHAMINE 30 MG/ML IJ SOLN
INTRAMUSCULAR | Status: AC
  Administered 2020-07-13: 30 mg via INTRAVENOUS
  Filled 2020-07-13: qty 1

## 2020-07-13 MED ORDER — MIDAZOLAM HCL 2 MG/2ML IJ SOLN
INTRAMUSCULAR | Status: DC | PRN
  Administered 2020-07-13: 2 mg via INTRAVENOUS

## 2020-07-13 NOTE — Anesthesia Postprocedure Evaluation (Signed)
Anesthesia Post Note  Patient: Dana Thompson  Procedure(s) Performed: ECT TX  Patient location during evaluation: PACU Anesthesia Type: General Level of consciousness: awake and alert Pain management: pain level controlled Vital Signs Assessment: post-procedure vital signs reviewed and stable Respiratory status: spontaneous breathing and respiratory function stable Cardiovascular status: stable Anesthetic complications: no   No complications documented.   Last Vitals:  Vitals:   07/13/20 1330 07/13/20 1356  BP: 102/66 112/68  Pulse: 88 87  Resp: 17 18  Temp:  36.8 C  SpO2: 95%     Last Pain:  Vitals:   07/13/20 1356  TempSrc:   PainSc: 0-No pain                 Cherith Tewell K

## 2020-07-13 NOTE — Anesthesia Preprocedure Evaluation (Signed)
Anesthesia Evaluation  Patient identified by MRN, date of birth, ID band Patient awake    Reviewed: Allergy & Precautions, H&P , NPO status , Patient's Chart, lab work & pertinent test results  History of Anesthesia Complications Negative for: history of anesthetic complications  Airway Mallampati: II       Dental   Pulmonary neg pulmonary ROS, neg sleep apnea, neg COPD,           Cardiovascular (-) angina(-) Past MI and (-) Cardiac Stents negative cardio ROS  (-) dysrhythmias      Neuro/Psych PSYCHIATRIC DISORDERS Anxiety Depression negative neurological ROS     GI/Hepatic Neg liver ROS, neg GERD  ,  Endo/Other  neg diabetes  Renal/GU negative Renal ROS  negative genitourinary   Musculoskeletal   Abdominal   Peds  Hematology   Anesthesia Other Findings   Reproductive/Obstetrics negative OB ROS                             Anesthesia Physical  Anesthesia Plan  ASA: II  Anesthesia Plan: General   Post-op Pain Management:    Induction: Intravenous  PONV Risk Score and Plan: TIVA  Airway Management Planned: Mask  Additional Equipment:   Intra-op Plan:   Post-operative Plan:   Informed Consent: I have reviewed the patients History and Physical, chart, labs and discussed the procedure including the risks, benefits and alternatives for the proposed anesthesia with the patient or authorized representative who has indicated his/her understanding and acceptance.       Plan Discussed with:   Anesthesia Plan Comments:         Anesthesia Quick Evaluation

## 2020-07-13 NOTE — Transfer of Care (Signed)
Immediate Anesthesia Transfer of Care Note  Patient: Dana Thompson  Procedure(s) Performed: ECT TX  Patient Location: PACU  Anesthesia Type:General  Level of Consciousness: drowsy  Airway & Oxygen Therapy: Patient Spontanous Breathing and Patient connected to face mask oxygen  Post-op Assessment: Report given to RN and Post -op Vital signs reviewed and stable  Post vital signs: Reviewed and stable  Last Vitals:  Vitals Value Taken Time  BP 112/64   Temp    Pulse 94   Resp 20   SpO2 97     Last Pain:  Vitals:   07/13/20 1036  TempSrc: Oral  PainSc: 0-No pain         Complications: No complications documented.

## 2020-07-13 NOTE — Anesthesia Procedure Notes (Signed)
Procedure Name: General with mask airway Date/Time: 07/13/2020 12:33 PM Performed by: Joanette Gula, Izmael Duross, CRNA Pre-anesthesia Checklist: Patient identified, Emergency Drugs available, Suction available, Patient being monitored and Timeout performed Patient Re-evaluated:Patient Re-evaluated prior to induction Oxygen Delivery Method: Circle system utilized Preoxygenation: Pre-oxygenation with 100% oxygen Induction Type: IV induction Ventilation: Mask ventilation throughout procedure

## 2020-07-14 ENCOUNTER — Other Ambulatory Visit: Payer: Self-pay | Admitting: Psychiatry

## 2020-07-14 NOTE — Procedures (Signed)
ECT SERVICES Physician's Interval Evaluation & Treatment Note  Patient Identification: Dana Thompson MRN:  [id number] Date of Evaluation:  07/14/2020 TX #: 4  MADRS:   MMSE:   P.E. Findings:  No no change to findings on physical.  Psychiatric Interval Note:  Mood continues to be reported as depressed  Subjective:  Patient is a 20 y.o. female seen for evaluation for Electroconvulsive Therapy. Depressed mood  Treatment Summary:   [x]   Right Unilateral             []  Bilateral   % Energy : 0.3 ms 20%   Impedance: 2300 ohms  Seizure Energy Index: 18,984 V squared  Postictal Suppression Index: 30%  Seizure Concordance Index: 97%  Medications  Pre Shock: Zofran 4 mg Robinul 0.2 mg Toradol 30 mg Brevital 80 mg succinylcholine 150 mg  Post Shock: Versed 2 mg  Seizure Duration: EMG 36 seconds EEG 87 seconds   Comments: Well-tolerated.  Patient does not feel there is been any improvement but family thinks there may be.  Continue with next treatment Friday.  Lungs:  [x]   Clear to auscultation               []  Other:   Heart:    [x]   Regular rhythm             []  irregular rhythm    [x]   Previous H&P reviewed, patient examined and there are NO CHANGES                 []   Previous H&P reviewed, patient examined and there are changes noted.   , MD 4/21/20225:19 PM

## 2020-07-14 NOTE — H&P (Signed)
Dana Thompson is an 20 y.o. female.   Chief Complaint: Follow-up with patient with chronic depression HPI: History of depression now getting first ECT course  Past Medical History:  Diagnosis Date  . ADHD (attention deficit hyperactivity disorder)   . Anxiety   . Attention deficit disorder   . Depression   . Irregular heartbeat    Premature Atrial Contractions/ Palpitations  . Pre-diabetes   . Urinary tract infection    in past, but not currently    History reviewed. No pertinent surgical history.  Family History  Problem Relation Age of Onset  . Cancer Maternal Grandmother   . Cancer Paternal Grandmother   . Hyperlipidemia Father   . Hypertension Father    Social History:  reports that she has never smoked. She has never used smokeless tobacco. She reports current alcohol use of about 1.0 standard drink of alcohol per week. She reports current drug use. Frequency: 7.00 times per week. Drug: Marijuana.  Allergies: No Known Allergies  (Not in a hospital admission)   No results found for this or any previous visit (from the past 48 hour(s)). No results found.  Review of Systems  Constitutional: Negative.   HENT: Negative.   Eyes: Negative.   Respiratory: Negative.   Cardiovascular: Negative.   Gastrointestinal: Negative.   Musculoskeletal: Negative.   Skin: Negative.   Neurological: Negative.   Psychiatric/Behavioral: Positive for dysphoric mood.    Blood pressure 112/68, pulse 87, temperature 98.3 F (36.8 C), resp. rate 18, height 5\' 6"  (1.676 m), weight 83.9 kg, last menstrual period 06/19/2020, SpO2 95 %. Physical Exam Vitals and nursing note reviewed.  Constitutional:      Appearance: She is well-developed.  HENT:     Head: Normocephalic and atraumatic.  Eyes:     Conjunctiva/sclera: Conjunctivae normal.     Pupils: Pupils are equal, round, and reactive to light.  Cardiovascular:     Heart sounds: Normal heart sounds.  Pulmonary:     Effort:  Pulmonary effort is normal.  Abdominal:     Palpations: Abdomen is soft.  Musculoskeletal:        General: Normal range of motion.     Cervical back: Normal range of motion.  Skin:    General: Skin is warm and dry.  Neurological:     Mental Status: She is alert.  Psychiatric:        Attention and Perception: Attention normal.        Mood and Affect: Mood is depressed.        Speech: Speech is delayed.        Behavior: Behavior is slowed.        Thought Content: Thought content does not include suicidal ideation.        Cognition and Memory: Cognition normal.        Judgment: Judgment normal.      Assessment/Plan Continue index course of ECT  06/21/2020, MD 07/14/2020, 5:18 PM

## 2020-07-15 ENCOUNTER — Encounter (HOSPITAL_COMMUNITY)
Admission: RE | Admit: 2020-07-15 | Discharge: 2020-07-15 | Disposition: A | Discharge status: Home / Self Care | Payer: 59 | Source: Ambulatory Visit | Attending: Psychiatry | Admitting: Psychiatry

## 2020-07-15 ENCOUNTER — Encounter: Payer: Self-pay | Admitting: Anesthesiology

## 2020-07-15 ENCOUNTER — Other Ambulatory Visit: Payer: Self-pay

## 2020-07-15 DIAGNOSIS — F332 Major depressive disorder, recurrent severe without psychotic features: Secondary | ICD-10-CM | POA: Diagnosis not present

## 2020-07-15 DIAGNOSIS — F339 Major depressive disorder, recurrent, unspecified: Secondary | ICD-10-CM | POA: Diagnosis not present

## 2020-07-15 MED ORDER — KETOROLAC TROMETHAMINE 30 MG/ML IJ SOLN
INTRAMUSCULAR | Status: AC
  Administered 2020-07-15: 30 mg via INTRAVENOUS
  Filled 2020-07-15: qty 1

## 2020-07-15 MED ORDER — METHOHEXITAL SODIUM 0.5 G IJ SOLR
INTRAMUSCULAR | Status: AC
  Filled 2020-07-15: qty 500

## 2020-07-15 MED ORDER — GLYCOPYRROLATE 0.2 MG/ML IJ SOLN: 0.2000 mg | Freq: Once | INTRAMUSCULAR | Status: AC

## 2020-07-15 MED ORDER — MIDAZOLAM HCL 2 MG/2ML IJ SOLN
INTRAMUSCULAR | Status: AC
  Filled 2020-07-15: qty 2

## 2020-07-15 MED ORDER — ONDANSETRON HCL 4 MG/2ML IJ SOLN
INTRAMUSCULAR | Status: AC
  Administered 2020-07-15: 4 mg via INTRAVENOUS
  Filled 2020-07-15: qty 2

## 2020-07-15 MED ORDER — MIDAZOLAM HCL 2 MG/2ML IJ SOLN: 2.0000 mg | Freq: Once | INTRAMUSCULAR | Status: DC

## 2020-07-15 MED ORDER — KETOROLAC TROMETHAMINE 30 MG/ML IJ SOLN: 30.0000 mg | Freq: Once | INTRAMUSCULAR | Status: AC

## 2020-07-15 MED ORDER — ONDANSETRON HCL 4 MG/2ML IJ SOLN: 4.0000 mg | Freq: Once | INTRAMUSCULAR | Status: AC

## 2020-07-15 MED ORDER — SODIUM CHLORIDE 0.9 % IV SOLN
500.0000 mL | Freq: Once | INTRAVENOUS | Status: AC
  Administered 2020-07-15: 500 mL via INTRAVENOUS

## 2020-07-15 MED ORDER — SUCCINYLCHOLINE CHLORIDE 200 MG/10ML IV SOSY
PREFILLED_SYRINGE | INTRAVENOUS | Status: AC
  Filled 2020-07-15: qty 10

## 2020-07-15 MED ORDER — SODIUM CHLORIDE 0.9 % IV SOLN: INTRAVENOUS | Status: DC | PRN

## 2020-07-15 MED ORDER — METHOHEXITAL SODIUM 100 MG/10ML IV SOSY
PREFILLED_SYRINGE | INTRAVENOUS | Status: DC | PRN
  Administered 2020-07-15: 80 mg via INTRAVENOUS

## 2020-07-15 MED ORDER — MIDAZOLAM HCL 2 MG/2ML IJ SOLN
INTRAMUSCULAR | Status: DC | PRN
  Administered 2020-07-15: 2 mg via INTRAVENOUS

## 2020-07-15 MED ORDER — GLYCOPYRROLATE 0.2 MG/ML IJ SOLN
INTRAMUSCULAR | Status: AC
  Administered 2020-07-15: 0.2 mg via INTRAVENOUS
  Filled 2020-07-15: qty 1

## 2020-07-15 MED ORDER — SUCCINYLCHOLINE CHLORIDE 20 MG/ML IJ SOLN
INTRAMUSCULAR | Status: DC | PRN
  Administered 2020-07-15: 100 mg via INTRAVENOUS

## 2020-07-15 NOTE — Anesthesia Postprocedure Evaluation (Signed)
Anesthesia Post Note  Patient: Dana Thompson  Procedure(s) Performed: ECT TX  Patient location during evaluation: PACU Anesthesia Type: General Level of consciousness: awake and alert Pain management: pain level controlled Vital Signs Assessment: post-procedure vital signs reviewed and stable Respiratory status: spontaneous breathing, nonlabored ventilation, respiratory function stable and patient connected to nasal cannula oxygen Cardiovascular status: blood pressure returned to baseline and stable Postop Assessment: no apparent nausea or vomiting Anesthetic complications: no   No complications documented.   Last Vitals:  Vitals:   07/15/20 1410 07/15/20 1420  BP: (!) 84/45 (!) 82/45  Pulse: 93 89  Resp: (!) 22 (!) 21  Temp:    SpO2: 91% 92%    Last Pain:  Vitals:   07/15/20 1420  TempSrc:   PainSc: Asleep                 Lenard Simmer

## 2020-07-15 NOTE — Transfer of Care (Signed)
Immediate Anesthesia Transfer of Care Note  Patient: Dana Thompson  Procedure(s) Performed: ECT TX  Patient Location: PACU  Anesthesia Type:General  Level of Consciousness: sedated and unresponsive  Airway & Oxygen Therapy: Patient Spontanous Breathing and Patient connected to face mask oxygen  Post-op Assessment: Report given to RN and Post -op Vital signs reviewed and stable  Post vital signs: Reviewed and stable  Last Vitals:  Vitals Value Taken Time  BP 105/56 07/15/20 1350  Temp 36.2 C 07/15/20 1350  Pulse 95 07/15/20 1353  Resp 20 07/15/20 1353  SpO2 96 % 07/15/20 1353  Vitals shown include unvalidated device data.  Last Pain:  Vitals:   07/15/20 1350  TempSrc:   PainSc: Asleep         Complications: No complications documented.

## 2020-07-15 NOTE — Anesthesia Procedure Notes (Signed)
Date/Time: 07/15/2020 1:34 PM Performed by: Joanette Gula, Emmalin Jaquess, CRNA Pre-anesthesia Checklist: Patient identified, Emergency Drugs available, Suction available, Patient being monitored and Timeout performed Patient Re-evaluated:Patient Re-evaluated prior to induction Oxygen Delivery Method: Circle system utilized Preoxygenation: Pre-oxygenation with 100% oxygen Induction Type: IV induction Ventilation: Mask ventilation throughout procedure and Mask ventilation without difficulty

## 2020-07-15 NOTE — Anesthesia Preprocedure Evaluation (Signed)
Anesthesia Evaluation  Patient identified by MRN, date of birth, ID band Patient awake    Reviewed: Allergy & Precautions, H&P , NPO status , Patient's Chart, lab work & pertinent test results  History of Anesthesia Complications Negative for: history of anesthetic complications  Airway Mallampati: II       Dental   Pulmonary neg pulmonary ROS, neg sleep apnea, neg COPD,           Cardiovascular (-) angina(-) Past MI and (-) Cardiac Stents negative cardio ROS  (-) dysrhythmias      Neuro/Psych PSYCHIATRIC DISORDERS Anxiety Depression negative neurological ROS     GI/Hepatic Neg liver ROS, neg GERD  ,  Endo/Other  neg diabetes  Renal/GU negative Renal ROS  negative genitourinary   Musculoskeletal   Abdominal   Peds  Hematology   Anesthesia Other Findings   Reproductive/Obstetrics negative OB ROS                             Anesthesia Physical  Anesthesia Plan  ASA: II  Anesthesia Plan: General   Post-op Pain Management:    Induction: Intravenous  PONV Risk Score and Plan: TIVA  Airway Management Planned: Mask  Additional Equipment:   Intra-op Plan:   Post-operative Plan:   Informed Consent: I have reviewed the patients History and Physical, chart, labs and discussed the procedure including the risks, benefits and alternatives for the proposed anesthesia with the patient or authorized representative who has indicated his/her understanding and acceptance.       Plan Discussed with:   Anesthesia Plan Comments:         Anesthesia Quick Evaluation  

## 2020-07-15 NOTE — H&P (Signed)
Dana Thompson is an 20 y.o. female.   Chief Complaint: Patient with chronic depression as part of bipolar disorder receiving ECT.  No immediate complaints HPI: Possible slight improvement so far with for going on 5 treatments  Past Medical History:  Diagnosis Date  . ADHD (attention deficit hyperactivity disorder)   . Anxiety   . Attention deficit disorder   . Depression   . Irregular heartbeat    Premature Atrial Contractions/ Palpitations  . Pre-diabetes   . Urinary tract infection    in past, but not currently    History reviewed. No pertinent surgical history.  Family History  Problem Relation Age of Onset  . Cancer Maternal Grandmother   . Cancer Paternal Grandmother   . Hyperlipidemia Father   . Hypertension Father    Social History:  reports that she has never smoked. She has never used smokeless tobacco. She reports current alcohol use of about 1.0 standard drink of alcohol per week. She reports current drug use. Frequency: 7.00 times per week. Drug: Marijuana.  Allergies: No Known Allergies  (Not in a hospital admission)   No results found for this or any previous visit (from the past 48 hour(s)). No results found.  Review of Systems  Constitutional: Negative.   HENT: Negative.   Eyes: Negative.   Respiratory: Negative.   Cardiovascular: Negative.   Gastrointestinal: Negative.   Musculoskeletal: Negative.   Skin: Negative.   Neurological: Negative.   Hematological: Negative.   Psychiatric/Behavioral: Positive for dysphoric mood. Negative for suicidal ideas.    Blood pressure 118/77, pulse 80, temperature 98.3 F (36.8 C), temperature source Oral, resp. rate 18, height 5\' 6"  (1.676 m), weight 83.9 kg, last menstrual period 06/19/2020, SpO2 98 %. Physical Exam Vitals and nursing note reviewed.  Constitutional:      Appearance: She is well-developed.  HENT:     Head: Normocephalic and atraumatic.  Eyes:     Conjunctiva/sclera: Conjunctivae normal.      Pupils: Pupils are equal, round, and reactive to light.  Cardiovascular:     Heart sounds: Normal heart sounds.  Pulmonary:     Effort: Pulmonary effort is normal.  Abdominal:     Palpations: Abdomen is soft.  Musculoskeletal:        General: Normal range of motion.     Cervical back: Normal range of motion.  Skin:    General: Skin is warm and dry.  Neurological:     General: No focal deficit present.     Mental Status: She is alert.  Psychiatric:        Attention and Perception: Attention normal.        Mood and Affect: Mood is depressed.        Speech: Speech normal.        Behavior: Behavior is cooperative.        Thought Content: Thought content normal.        Cognition and Memory: Cognition normal.      Assessment/Plan Treatment today and we will plan on reassessing with likely further treatments after the following week  06/21/2020, MD 07/15/2020, 12:01 PM

## 2020-07-17 NOTE — Procedures (Signed)
ECT SERVICES Physician's Interval Evaluation & Treatment Note  Patient Identification: Dana Thompson MRN:  [id number] Date of Evaluation:  07/15/2020 TX #: 5  MADRS:   MMSE:   P.E. Findings:  Unremarkable   Psychiatric Interval Note:  Not subjective improvement  Subjective:  Patient is a 20 y.o. female seen for evaluation for Electroconvulsive Therapy. Doesn't feel better  Treatment Summary:   [x]   Right Unilateral             []  Bilateral   % Energy : 0.40ms, 20%   Impedance: 2080 ohms  Seizure Energy Index: 1m mv 2  Postictal Suppression Index: 76%  Seizure Concordance Index: 98%  Medications  Pre Shock: zofran 4mg  torodol 30mg , robinol 0.2mg , brevital 80 mg, succinylcholine 150mg   Post Shock: versed 2 mg  Seizure Duration: 37s emg, 77 s eeg   Comments: Patient does not want to continue   Lungs:  [x]   Clear to auscultation               []  Other:   Heart:    [x]   Regular rhythm             []  irregular rhythm    [x]   Previous H&P reviewed, patient examined and there are NO CHANGES                 []   Previous H&P reviewed, patient examined and there are changes noted.   2081, MD 4/22/20229:11 PM

## 2020-07-26 ENCOUNTER — Other Ambulatory Visit: Payer: Self-pay | Admitting: Psychiatry

## 2020-07-27 ENCOUNTER — Encounter: Payer: Self-pay | Admitting: Anesthesiology

## 2020-07-27 ENCOUNTER — Other Ambulatory Visit: Payer: Self-pay

## 2020-07-27 ENCOUNTER — Encounter
Admission: RE | Admit: 2020-07-27 | Discharge: 2020-07-27 | Disposition: A | Discharge status: Home / Self Care | Payer: 59 | Source: Ambulatory Visit | Attending: Psychiatry | Admitting: Psychiatry

## 2020-07-27 DIAGNOSIS — F332 Major depressive disorder, recurrent severe without psychotic features: Secondary | ICD-10-CM | POA: Diagnosis not present

## 2020-07-27 DIAGNOSIS — F339 Major depressive disorder, recurrent, unspecified: Secondary | ICD-10-CM | POA: Diagnosis present

## 2020-07-27 LAB — POCT PREGNANCY, URINE: Preg Test, Ur: NEGATIVE

## 2020-07-27 MED ORDER — MIDAZOLAM HCL 2 MG/2ML IJ SOLN
2.0000 mg | Freq: Once | INTRAMUSCULAR | Status: AC
  Administered 2020-07-27: 2 mg via INTRAVENOUS

## 2020-07-27 MED ORDER — SODIUM CHLORIDE 0.9 % IV SOLN: INTRAVENOUS | Status: DC | PRN

## 2020-07-27 MED ORDER — GLYCOPYRROLATE 0.2 MG/ML IJ SOLN
INTRAMUSCULAR | Status: AC
  Administered 2020-07-27: 0.2 mg via INTRAVENOUS
  Filled 2020-07-27: qty 1

## 2020-07-27 MED ORDER — ONDANSETRON HCL 4 MG/2ML IJ SOLN: 4.0000 mg | Freq: Once | INTRAMUSCULAR | Status: AC

## 2020-07-27 MED ORDER — SODIUM CHLORIDE 0.9 % IV SOLN
500.0000 mL | Freq: Once | INTRAVENOUS | Status: AC
  Administered 2020-07-27: 500 mL via INTRAVENOUS

## 2020-07-27 MED ORDER — SUCCINYLCHOLINE CHLORIDE 20 MG/ML IJ SOLN
INTRAMUSCULAR | Status: DC | PRN
  Administered 2020-07-27: 100 mg via INTRAVENOUS

## 2020-07-27 MED ORDER — GLYCOPYRROLATE 0.2 MG/ML IJ SOLN: 0.2000 mg | Freq: Once | INTRAMUSCULAR | Status: AC

## 2020-07-27 MED ORDER — KETOROLAC TROMETHAMINE 30 MG/ML IJ SOLN: 30.0000 mg | Freq: Once | INTRAMUSCULAR | Status: AC

## 2020-07-27 MED ORDER — ONDANSETRON HCL 4 MG/2ML IJ SOLN
INTRAMUSCULAR | Status: AC
  Administered 2020-07-27: 4 mg via INTRAVENOUS
  Filled 2020-07-27: qty 2

## 2020-07-27 MED ORDER — METHOHEXITAL SODIUM 100 MG/10ML IV SOSY
PREFILLED_SYRINGE | INTRAVENOUS | Status: DC | PRN
  Administered 2020-07-27: 80 mg via INTRAVENOUS

## 2020-07-27 MED ORDER — KETOROLAC TROMETHAMINE 30 MG/ML IJ SOLN
INTRAMUSCULAR | Status: AC
  Administered 2020-07-27: 30 mg via INTRAVENOUS
  Filled 2020-07-27: qty 1

## 2020-07-27 NOTE — Transfer of Care (Signed)
Immediate Anesthesia Transfer of Care Note  Patient: Dana Thompson  Procedure(s) Performed: ECT TX  Patient Location: PACU  Anesthesia Type:General  Level of Consciousness: sedated  Airway & Oxygen Therapy: Patient Spontanous Breathing and Patient connected to face mask oxygen  Post-op Assessment: Report given to RN and Post -op Vital signs reviewed and stable  Post vital signs: Reviewed and stable  Last Vitals:  Vitals Value Taken Time  BP    Temp    Pulse    Resp    SpO2      Last Pain:  Vitals:   07/27/20 1115  TempSrc:   PainSc: 0-No pain         Complications: No complications documented.

## 2020-07-27 NOTE — Anesthesia Procedure Notes (Signed)
Procedure Name: General with mask airway Date/Time: 07/27/2020 1:40 PM Performed by: Irving Burton, CRNA Pre-anesthesia Checklist: Patient identified, Emergency Drugs available, Suction available and Patient being monitored Patient Re-evaluated:Patient Re-evaluated prior to induction Oxygen Delivery Method: Circle system utilized Preoxygenation: Pre-oxygenation with 100% oxygen Induction Type: IV induction Ventilation: Mask ventilation without difficulty and Mask ventilation throughout procedure Airway Equipment and Method: Bite block Placement Confirmation: positive ETCO2 Dental Injury: Teeth and Oropharynx as per pre-operative assessment

## 2020-07-27 NOTE — Procedures (Signed)
ECT SERVICES Physician's Interval Evaluation & Treatment Note  Patient Identification: Dana Thompson MRN:  [id number] Date of Evaluation:  07/27/2020 TX #: 6  MADRS:   MMSE:   P.E. Findings:  normal  Psychiatric Interval Note:  Affect better  Subjective:  Patient is a 20 y.o. female seen for evaluation for Electroconvulsive Therapy. Family notes improvement  Treatment Summary:   []   Right Unilateral             []  Bilateral   % Energy : 0.34ms, 20%   Impedance: 1920 ohms  Seizure Energy Index: mv2  Postictal Suppression Index:    Seizure Concordance Index: 97%  Medications  Pre Shock: zofran 4mg , robinol 0.2mg , torodol 30mg , brevital 80mg , suc 150mg   Post Shock: versed 2mg   Seizure Duration: 36s emg, 128s eeg   Comments: One more on friday   Lungs:  [x]   Clear to auscultation               []  Other:   Heart:    [x]   Regular rhythm             []  irregular rhythm    [x]   Previous H&P reviewed, patient examined and there are NO CHANGES                 []   Previous H&P reviewed, patient examined and there are changes noted.   1m, MD 5/4/20228:30 PM

## 2020-07-27 NOTE — H&P (Signed)
Dana Thompson is an 20 y.o. female.   Chief Complaint: Patient herself has no specific complaint but continues to feel chronic irritability and dysphoria HPI: History of chronic anxiety mood lability bipolar disorder.  After the last maintenance treatment family reports that they noticed that there have been significant improvements although the patient does not necessarily see them.  Mood affect and behavior are all more normal  Past Medical History:  Diagnosis Date  . ADHD (attention deficit hyperactivity disorder)   . Anxiety   . Attention deficit disorder   . Depression   . Irregular heartbeat    Premature Atrial Contractions/ Palpitations  . Pre-diabetes   . Urinary tract infection    in past, but not currently    History reviewed. No pertinent surgical history.  Family History  Problem Relation Age of Onset  . Cancer Maternal Grandmother   . Cancer Paternal Grandmother   . Hyperlipidemia Father   . Hypertension Father    Social History:  reports that she has never smoked. She has never used smokeless tobacco. She reports current alcohol use of about 1.0 standard drink of alcohol per week. She reports current drug use. Frequency: 7.00 times per week. Drug: Marijuana.  Allergies: No Known Allergies  (Not in a hospital admission)   Results for orders placed or performed during the hospital encounter of 07/27/20 (from the past 48 hour(s))  Pregnancy, urine POC     Status: None   Collection Time: 07/27/20 11:04 AM  Result Value Ref Range   Preg Test, Ur NEGATIVE NEGATIVE    Comment:        THE SENSITIVITY OF THIS METHODOLOGY IS >24 mIU/mL    No results found.  Review of Systems  Constitutional: Negative.   HENT: Negative.   Eyes: Negative.   Respiratory: Negative.   Cardiovascular: Negative.   Gastrointestinal: Negative.   Musculoskeletal: Negative.   Skin: Negative.   Neurological: Negative.   Psychiatric/Behavioral: Negative.   All other systems reviewed  and are negative.   Blood pressure 112/77, pulse 85, temperature 98.4 F (36.9 C), temperature source Oral, resp. rate 18, height 5\' 6"  (1.676 m), weight 83.9 kg, SpO2 98 %. Physical Exam Vitals and nursing note reviewed.  Constitutional:      Appearance: She is well-developed.  HENT:     Head: Normocephalic and atraumatic.  Eyes:     Conjunctiva/sclera: Conjunctivae normal.     Pupils: Pupils are equal, round, and reactive to light.  Cardiovascular:     Heart sounds: Normal heart sounds.  Pulmonary:     Effort: Pulmonary effort is normal.  Abdominal:     Palpations: Abdomen is soft.  Musculoskeletal:        General: Normal range of motion.     Cervical back: Normal range of motion.  Skin:    General: Skin is warm and dry.  Neurological:     General: No focal deficit present.     Mental Status: She is alert.  Psychiatric:        Mood and Affect: Mood normal.        Behavior: Behavior normal.        Thought Content: Thought content normal.        Judgment: Judgment normal.      Assessment/Plan Is agreeable to a continued trial of a couple treatments and then moving likely to a maintenance phase.  , MD 07/27/2020, 12:25 PM

## 2020-07-27 NOTE — Anesthesia Preprocedure Evaluation (Signed)
Anesthesia Evaluation  Patient identified by MRN, date of birth, ID band Patient awake    Reviewed: Allergy & Precautions, H&P , NPO status , Patient's Chart, lab work & pertinent test results  History of Anesthesia Complications Negative for: history of anesthetic complications  Airway Mallampati: II       Dental   Pulmonary neg pulmonary ROS, neg sleep apnea, neg COPD,           Cardiovascular (-) angina(-) Past MI and (-) Cardiac Stents negative cardio ROS  (-) dysrhythmias      Neuro/Psych PSYCHIATRIC DISORDERS Anxiety Depression negative neurological ROS     GI/Hepatic Neg liver ROS, neg GERD  ,  Endo/Other  neg diabetes  Renal/GU negative Renal ROS  negative genitourinary   Musculoskeletal   Abdominal   Peds  Hematology   Anesthesia Other Findings   Reproductive/Obstetrics negative OB ROS                             Anesthesia Physical  Anesthesia Plan  ASA: II  Anesthesia Plan: General   Post-op Pain Management:    Induction: Intravenous  PONV Risk Score and Plan: TIVA  Airway Management Planned: Mask  Additional Equipment:   Intra-op Plan:   Post-operative Plan:   Informed Consent: I have reviewed the patients History and Physical, chart, labs and discussed the procedure including the risks, benefits and alternatives for the proposed anesthesia with the patient or authorized representative who has indicated his/her understanding and acceptance.       Plan Discussed with:   Anesthesia Plan Comments:         Anesthesia Quick Evaluation  

## 2020-07-27 NOTE — Anesthesia Postprocedure Evaluation (Signed)
Anesthesia Post Note  Patient: Dana Thompson  Procedure(s) Performed: ECT TX  Patient location during evaluation: PACU Anesthesia Type: General Level of consciousness: awake and alert Pain management: pain level controlled Vital Signs Assessment: post-procedure vital signs reviewed and stable Respiratory status: spontaneous breathing, nonlabored ventilation, respiratory function stable and patient connected to nasal cannula oxygen Cardiovascular status: blood pressure returned to baseline and stable Postop Assessment: no apparent nausea or vomiting Anesthetic complications: no   No complications documented.   Last Vitals:  Vitals:   07/27/20 1402 07/27/20 1412  BP: (!) 91/44 (!) 93/51  Pulse: 91 91  Resp: 20 19  Temp:    SpO2: 98% 99%    Last Pain:  Vitals:   07/27/20 1412  TempSrc:   PainSc: 0-No pain                 Lenard Simmer

## 2020-07-28 ENCOUNTER — Other Ambulatory Visit: Payer: Self-pay | Admitting: Psychiatry

## 2020-07-29 ENCOUNTER — Encounter: Payer: Self-pay | Admitting: Certified Registered Nurse Anesthetist

## 2020-07-29 ENCOUNTER — Encounter (HOSPITAL_BASED_OUTPATIENT_CLINIC_OR_DEPARTMENT_OTHER)
Admission: RE | Admit: 2020-07-29 | Discharge: 2020-07-29 | Disposition: A | Discharge status: Home / Self Care | Payer: 59 | Source: Ambulatory Visit | Attending: Psychiatry | Admitting: Psychiatry

## 2020-07-29 ENCOUNTER — Other Ambulatory Visit: Payer: Self-pay

## 2020-07-29 DIAGNOSIS — F332 Major depressive disorder, recurrent severe without psychotic features: Secondary | ICD-10-CM

## 2020-07-29 MED ORDER — KETOROLAC TROMETHAMINE 30 MG/ML IJ SOLN: 30.0000 mg | Freq: Once | INTRAMUSCULAR | Status: AC

## 2020-07-29 MED ORDER — GLYCOPYRROLATE 0.2 MG/ML IJ SOLN
INTRAMUSCULAR | Status: AC
  Administered 2020-07-29: 0.2 mg via INTRAVENOUS
  Filled 2020-07-29: qty 1

## 2020-07-29 MED ORDER — ONDANSETRON HCL 4 MG/2ML IJ SOLN: 4.0000 mg | Freq: Once | INTRAMUSCULAR | Status: AC

## 2020-07-29 MED ORDER — KETOROLAC TROMETHAMINE 30 MG/ML IJ SOLN
INTRAMUSCULAR | Status: AC
  Administered 2020-07-29: 30 mg via INTRAVENOUS
  Filled 2020-07-29: qty 1

## 2020-07-29 MED ORDER — SODIUM CHLORIDE 0.9 % IV SOLN
500.0000 mL | Freq: Once | INTRAVENOUS | Status: AC
  Administered 2020-07-29: 500 mL via INTRAVENOUS

## 2020-07-29 MED ORDER — MIDAZOLAM HCL 2 MG/2ML IJ SOLN
2.0000 mg | Freq: Once | INTRAMUSCULAR | Status: AC
  Administered 2020-07-29: 2 mg via INTRAVENOUS

## 2020-07-29 MED ORDER — GLYCOPYRROLATE 0.2 MG/ML IJ SOLN: 0.2000 mg | Freq: Once | INTRAMUSCULAR | Status: AC

## 2020-07-29 MED ORDER — SUCCINYLCHOLINE CHLORIDE 200 MG/10ML IV SOSY
PREFILLED_SYRINGE | INTRAVENOUS | Status: AC
  Filled 2020-07-29: qty 10

## 2020-07-29 MED ORDER — MIDAZOLAM HCL 2 MG/2ML IJ SOLN
INTRAMUSCULAR | Status: AC
  Filled 2020-07-29: qty 2

## 2020-07-29 MED ORDER — METHOHEXITAL SODIUM 100 MG/10ML IV SOSY
PREFILLED_SYRINGE | INTRAVENOUS | Status: DC | PRN
  Administered 2020-07-29: 80 mg via INTRAVENOUS

## 2020-07-29 MED ORDER — SODIUM CHLORIDE 0.9 % IV SOLN: INTRAVENOUS | Status: DC | PRN

## 2020-07-29 MED ORDER — ONDANSETRON HCL 4 MG/2ML IJ SOLN
INTRAMUSCULAR | Status: AC
  Administered 2020-07-29: 4 mg via INTRAVENOUS
  Filled 2020-07-29: qty 2

## 2020-07-29 MED ORDER — SUCCINYLCHOLINE CHLORIDE 20 MG/ML IJ SOLN
INTRAMUSCULAR | Status: DC | PRN
  Administered 2020-07-29: 100 mg via INTRAVENOUS

## 2020-07-29 NOTE — Anesthesia Postprocedure Evaluation (Signed)
Anesthesia Post Note  Patient: Dana Thompson  Procedure(s) Performed: ECT TX  Patient location during evaluation: PACU Anesthesia Type: General Level of consciousness: awake and alert Pain management: pain level controlled Vital Signs Assessment: post-procedure vital signs reviewed and stable Respiratory status: spontaneous breathing, nonlabored ventilation, respiratory function stable and patient connected to nasal cannula oxygen Cardiovascular status: blood pressure returned to baseline and stable Postop Assessment: no apparent nausea or vomiting Anesthetic complications: no   No complications documented.   Last Vitals:  Vitals:   07/29/20 1400 07/29/20 1419  BP: (!) 100/54 96/64  Pulse: 82 89  Resp: (!) 21 (!) 23  Temp:  36.7 C  SpO2: 96% 97%    Last Pain:  Vitals:   07/29/20 1419  TempSrc:   PainSc: 0-No pain                 Lenard Simmer

## 2020-07-29 NOTE — Anesthesia Preprocedure Evaluation (Signed)
Anesthesia Evaluation  Patient identified by MRN, date of birth, ID band Patient awake    Reviewed: Allergy & Precautions, H&P , NPO status , Patient's Chart, lab work & pertinent test results  History of Anesthesia Complications Negative for: history of anesthetic complications  Airway Mallampati: II       Dental   Pulmonary neg pulmonary ROS, neg sleep apnea, neg COPD,           Cardiovascular (-) angina(-) Past MI and (-) Cardiac Stents negative cardio ROS  (-) dysrhythmias      Neuro/Psych PSYCHIATRIC DISORDERS Anxiety Depression negative neurological ROS     GI/Hepatic Neg liver ROS, neg GERD  ,  Endo/Other  neg diabetes  Renal/GU negative Renal ROS  negative genitourinary   Musculoskeletal   Abdominal   Peds  Hematology   Anesthesia Other Findings   Reproductive/Obstetrics negative OB ROS                             Anesthesia Physical  Anesthesia Plan  ASA: II  Anesthesia Plan: General   Post-op Pain Management:    Induction: Intravenous  PONV Risk Score and Plan: TIVA  Airway Management Planned: Mask  Additional Equipment:   Intra-op Plan:   Post-operative Plan:   Informed Consent: I have reviewed the patients History and Physical, chart, labs and discussed the procedure including the risks, benefits and alternatives for the proposed anesthesia with the patient or authorized representative who has indicated his/her understanding and acceptance.       Plan Discussed with:   Anesthesia Plan Comments:         Anesthesia Quick Evaluation  

## 2020-07-29 NOTE — Anesthesia Procedure Notes (Signed)
Procedure Name: General with mask airway Date/Time: 07/29/2020 1:04 PM Performed by: Ginger Carne, CRNA Pre-anesthesia Checklist: Patient identified, Emergency Drugs available, Suction available, Patient being monitored and Timeout performed Patient Re-evaluated:Patient Re-evaluated prior to induction Oxygen Delivery Method: Circle system utilized Preoxygenation: Pre-oxygenation with 100% oxygen Induction Type: IV induction Ventilation: Mask ventilation without difficulty

## 2020-07-29 NOTE — Transfer of Care (Signed)
Immediate Anesthesia Transfer of Care Note  Patient: Dana Thompson  Procedure(s) Performed: ECT TX  Patient Location: PACU  Anesthesia Type:General  Level of Consciousness: sedated  Airway & Oxygen Therapy: Patient Spontanous Breathing and Patient connected to face mask oxygen  Post-op Assessment: Report given to RN and Post -op Vital signs reviewed and stable  Post vital signs: Reviewed and stable  Last Vitals:  Vitals Value Taken Time  BP 116/65 07/29/20 1325  Temp    Pulse 88 07/29/20 1325  Resp 21 07/29/20 1325  SpO2 97 % 07/29/20 1325    Last Pain:  Vitals:   07/29/20 1001  TempSrc: Oral  PainSc: 0-No pain         Complications: No complications documented.

## 2020-08-02 ENCOUNTER — Other Ambulatory Visit: Payer: Self-pay | Admitting: Psychiatry

## 2020-08-05 NOTE — Procedures (Signed)
ECT SERVICES Physician's Interval Evaluation & Treatment Note  Patient Identification: Dana Thompson MRN:  [id number] Date of Evaluation:  07/29/2020 TX #: 7  MADRS:   MMSE:   P.E. Findings:  Unremarkable physical exam  Psychiatric Interval Note:  Mood slightly improved  Subjective:  Patient is a 20 y.o. female seen for evaluation for Electroconvulsive Therapy. Patient is unsure but family feels she is definitely improved  Treatment Summary:   [x]   Right Unilateral             []  Bilateral   % Energy : 0.3 ms 20%   Impedance: 2270 ohms  Seizure Energy Index: 13,819 V squared  Postictal Suppression Index: 90%  Seizure Concordance Index: 97%  Medications  Pre Shock: Zofran 4 mg Robinul 0.2 mg Toradol 30 mg Brevital 80 mg succinylcholine 150 mg  Post Shock: Versed 2 mg  Seizure Duration: 38 seconds EMG 92 seconds EEG   Comments: Follow-up 1 week  Lungs:  [x]   Clear to auscultation               []  Other:   Heart:    [x]   Regular rhythm             []  irregular rhythm    [x]   Previous H&P reviewed, patient examined and there are NO CHANGES                 []   Previous H&P reviewed, patient examined and there are changes noted.   , MD 5/6/20225:03 PM

## 2020-08-05 NOTE — H&P (Signed)
Dana Thompson is an 20 y.o. female.   Chief Complaint: Patient reports mood is slightly improved HPI: Recurrent depression  Past Medical History:  Diagnosis Date  . ADHD (attention deficit hyperactivity disorder)   . Anxiety   . Attention deficit disorder   . Depression   . Irregular heartbeat    Premature Atrial Contractions/ Palpitations  . Pre-diabetes   . Urinary tract infection    in past, but not currently    History reviewed. No pertinent surgical history.  Family History  Problem Relation Age of Onset  . Cancer Maternal Grandmother   . Cancer Paternal Grandmother   . Hyperlipidemia Father   . Hypertension Father    Social History:  reports that she has never smoked. She has never used smokeless tobacco. She reports current alcohol use of about 1.0 standard drink of alcohol per week. She reports current drug use. Frequency: 7.00 times per week. Drug: Marijuana.  Allergies: No Known Allergies  (Not in a hospital admission)   No results found for this or any previous visit (from the past 48 hour(s)). No results found.  Review of Systems  Constitutional: Negative.   HENT: Negative.   Eyes: Negative.   Respiratory: Negative.   Cardiovascular: Negative.   Gastrointestinal: Negative.   Musculoskeletal: Negative.   Skin: Negative.   Neurological: Negative.   Psychiatric/Behavioral: Negative.   All other systems reviewed and are negative.   Blood pressure 96/64, pulse 89, temperature 98.1 F (36.7 C), resp. rate (!) 23, height 5\' 6"  (1.676 m), weight 83.9 kg, SpO2 97 %. Physical Exam Vitals and nursing note reviewed.  Constitutional:      Appearance: She is well-developed.  HENT:     Head: Normocephalic and atraumatic.  Eyes:     Conjunctiva/sclera: Conjunctivae normal.     Pupils: Pupils are equal, round, and reactive to light.  Cardiovascular:     Heart sounds: Normal heart sounds.  Pulmonary:     Effort: Pulmonary effort is normal.  Abdominal:      Palpations: Abdomen is soft.  Musculoskeletal:        General: Normal range of motion.     Cervical back: Normal range of motion.  Skin:    General: Skin is warm and dry.  Neurological:     General: No focal deficit present.     Mental Status: She is alert.  Psychiatric:        Mood and Affect: Mood normal.        Thought Content: Thought content normal.      Assessment/Plan We are returning for some maintenance treatment perhaps weekly  , MD 07/29/2020, 5:01 PM

## 2020-08-11 ENCOUNTER — Other Ambulatory Visit: Payer: Self-pay | Admitting: Psychiatry

## 2020-08-12 ENCOUNTER — Encounter (HOSPITAL_BASED_OUTPATIENT_CLINIC_OR_DEPARTMENT_OTHER)
Admission: RE | Admit: 2020-08-12 | Discharge: 2020-08-12 | Disposition: A | Discharge status: Home / Self Care | Payer: 59 | Source: Ambulatory Visit | Attending: Psychiatry | Admitting: Psychiatry

## 2020-08-12 ENCOUNTER — Encounter: Payer: Self-pay | Admitting: Anesthesiology

## 2020-08-12 ENCOUNTER — Other Ambulatory Visit: Payer: Self-pay

## 2020-08-12 ENCOUNTER — Ambulatory Visit: Payer: Self-pay | Admitting: Anesthesiology

## 2020-08-12 DIAGNOSIS — F332 Major depressive disorder, recurrent severe without psychotic features: Secondary | ICD-10-CM | POA: Diagnosis not present

## 2020-08-12 MED ORDER — SUCCINYLCHOLINE CHLORIDE 200 MG/10ML IV SOSY
PREFILLED_SYRINGE | INTRAVENOUS | Status: DC | PRN
  Administered 2020-08-12: 100 mg via INTRAVENOUS

## 2020-08-12 MED ORDER — ONDANSETRON HCL 4 MG/2ML IJ SOLN
4.0000 mg | Freq: Once | INTRAMUSCULAR | Status: AC
  Administered 2020-08-12: 4 mg via INTRAVENOUS

## 2020-08-12 MED ORDER — MIDAZOLAM HCL 2 MG/2ML IJ SOLN
INTRAMUSCULAR | Status: DC | PRN
  Administered 2020-08-12: 2 mg via INTRAVENOUS

## 2020-08-12 MED ORDER — MIDAZOLAM HCL 2 MG/2ML IJ SOLN
INTRAMUSCULAR | Status: AC
  Filled 2020-08-12: qty 2

## 2020-08-12 MED ORDER — GLYCOPYRROLATE 0.2 MG/ML IJ SOLN
0.2000 mg | Freq: Once | INTRAMUSCULAR | Status: AC
  Administered 2020-08-12: 0.2 mg via INTRAVENOUS

## 2020-08-12 MED ORDER — MIDAZOLAM HCL 2 MG/2ML IJ SOLN: 2.0000 mg | Freq: Once | INTRAMUSCULAR | Status: DC

## 2020-08-12 MED ORDER — KETOROLAC TROMETHAMINE 30 MG/ML IJ SOLN: 30.0000 mg | Freq: Once | INTRAMUSCULAR | Status: DC

## 2020-08-12 MED ORDER — ONDANSETRON HCL 4 MG/2ML IJ SOLN: 4.0000 mg | Freq: Once | INTRAMUSCULAR | Status: DC

## 2020-08-12 MED ORDER — KETOROLAC TROMETHAMINE 30 MG/ML IJ SOLN
30.0000 mg | Freq: Once | INTRAMUSCULAR | Status: AC
  Administered 2020-08-12: 30 mg via INTRAVENOUS

## 2020-08-12 MED ORDER — GLYCOPYRROLATE 0.2 MG/ML IJ SOLN
INTRAMUSCULAR | Status: AC
  Filled 2020-08-12: qty 1

## 2020-08-12 MED ORDER — SODIUM CHLORIDE 0.9 % IV SOLN: 500.0000 mL | Freq: Once | INTRAVENOUS | Status: AC

## 2020-08-12 MED ORDER — SODIUM CHLORIDE 0.9 % IV SOLN
500.0000 mL | Freq: Once | INTRAVENOUS | Status: AC
  Administered 2020-08-12: 500 mL via INTRAVENOUS

## 2020-08-12 MED ORDER — ONDANSETRON HCL 4 MG/2ML IJ SOLN
INTRAMUSCULAR | Status: AC
  Filled 2020-08-12: qty 2

## 2020-08-12 MED ORDER — KETOROLAC TROMETHAMINE 30 MG/ML IJ SOLN
INTRAMUSCULAR | Status: AC
  Filled 2020-08-12: qty 1

## 2020-08-12 MED ORDER — METHOHEXITAL SODIUM 100 MG/10ML IV SOSY
PREFILLED_SYRINGE | INTRAVENOUS | Status: DC | PRN
  Administered 2020-08-12: 80 mg via INTRAVENOUS

## 2020-08-12 MED ORDER — MIDAZOLAM HCL 2 MG/2ML IJ SOLN
2.0000 mg | Freq: Once | INTRAMUSCULAR | Status: DC
Start: 2020-08-12 — End: 2020-08-13

## 2020-08-12 MED ORDER — GLYCOPYRROLATE 0.2 MG/ML IJ SOLN: 0.2000 mg | Freq: Once | INTRAMUSCULAR | Status: DC

## 2020-08-12 NOTE — Anesthesia Preprocedure Evaluation (Signed)
Anesthesia Evaluation  Patient identified by MRN, date of birth, ID band Patient awake    Reviewed: Allergy & Precautions, NPO status , Patient's Chart, lab work & pertinent test results  History of Anesthesia Complications Negative for: history of anesthetic complications  Airway Mallampati: II  TM Distance: >3 FB Neck ROM: Full    Dental no notable dental hx.    Pulmonary neg pulmonary ROS, neg sleep apnea, neg COPD,    breath sounds clear to auscultation- rhonchi (-) wheezing      Cardiovascular Exercise Tolerance: Good (-) hypertension(-) CAD and (-) Past MI  Rhythm:Regular Rate:Normal - Systolic murmurs and - Diastolic murmurs    Neuro/Psych PSYCHIATRIC DISORDERS Anxiety Depression negative neurological ROS     GI/Hepatic negative GI ROS, Neg liver ROS,   Endo/Other  negative endocrine ROSneg diabetes  Renal/GU negative Renal ROS     Musculoskeletal negative musculoskeletal ROS (+)   Abdominal (+) - obese,   Peds  Hematology negative hematology ROS (+)   Anesthesia Other Findings Past Medical History: No date: ADHD (attention deficit hyperactivity disorder) No date: Anxiety No date: Attention deficit disorder No date: Depression No date: Irregular heartbeat     Comment:  Premature Atrial Contractions/ Palpitations No date: Pre-diabetes No date: Urinary tract infection     Comment:  in past, but not currently   Reproductive/Obstetrics                             Anesthesia Physical  Anesthesia Plan  ASA: II  Anesthesia Plan: General   Post-op Pain Management:    Induction: Intravenous  PONV Risk Score and Plan: 2 and Ondansetron  Airway Management Planned: Mask  Additional Equipment:   Intra-op Plan:   Post-operative Plan:   Informed Consent: I have reviewed the patients History and Physical, chart, labs and discussed the procedure including the risks, benefits  and alternatives for the proposed anesthesia with the patient or authorized representative who has indicated his/her understanding and acceptance.     Dental advisory given  Plan Discussed with: CRNA and Anesthesiologist  Anesthesia Plan Comments:         Anesthesia Quick Evaluation  

## 2020-08-12 NOTE — Addendum Note (Signed)
Addendum  created 08/12/20 1522 by Henrietta Hoover, CRNA   Attestation recorded in [place], Flowsheet accepted, [employer] filed

## 2020-08-12 NOTE — Anesthesia Postprocedure Evaluation (Signed)
Anesthesia Post Note  Patient: Dana Thompson  Procedure(s) Performed: ECT TX  Patient location during evaluation: PACU Anesthesia Type: General Level of consciousness: awake and alert Pain management: pain level controlled Vital Signs Assessment: post-procedure vital signs reviewed and stable Respiratory status: spontaneous breathing, nonlabored ventilation and respiratory function stable Cardiovascular status: blood pressure returned to baseline and stable Postop Assessment: no signs of nausea or vomiting Anesthetic complications: no   No complications documented.   Last Vitals:  Vitals:   08/12/20 1350 08/12/20 1400  BP: (!) 113/58 (!) 98/55  Pulse: 91 86  Resp: 19 (!) 23  Temp: 36.7 C   SpO2: 92% 94%    Last Pain:  Vitals:   08/12/20 1400  TempSrc:   PainSc: 0-No pain                 Dmarco Baldus

## 2020-08-12 NOTE — Transfer of Care (Signed)
Immediate Anesthesia Transfer of Care Note  Patient: Dana Thompson  Procedure(s) Performed: ECT TX  Patient Location: PACU  Anesthesia Type:General  Level of Consciousness: sedated  Airway & Oxygen Therapy: Patient Spontanous Breathing and Patient connected to face mask oxygen  Post-op Assessment: Report given to RN and Post -op Vital signs reviewed and stable  Post vital signs: Reviewed and stable  Last Vitals:  Vitals Value Taken Time  BP 114/66 08/12/20 1331  Temp 36.9 C 08/12/20 1330  Pulse 93 08/12/20 1335  Resp 20 08/12/20 1335  SpO2 96 % 08/12/20 1335  Vitals shown include unvalidated device data.  Last Pain:  Vitals:   08/12/20 1330  TempSrc:   PainSc: Asleep         Complications: No complications documented.

## 2020-08-14 NOTE — H&P (Signed)
Dana Thompson is an 20 y.o. female.   Chief Complaint: No current complaint.  Mood is good. HPI: History of recurrent severe depression  Past Medical History:  Diagnosis Date  . ADHD (attention deficit hyperactivity disorder)   . Anxiety   . Attention deficit disorder   . Depression   . Irregular heartbeat    Premature Atrial Contractions/ Palpitations  . Pre-diabetes   . Urinary tract infection    in past, but not currently    History reviewed. No pertinent surgical history.  Family History  Problem Relation Age of Onset  . Cancer Maternal Grandmother   . Cancer Paternal Grandmother   . Hyperlipidemia Father   . Hypertension Father    Social History:  reports that she has never smoked. She has never used smokeless tobacco. She reports current alcohol use of about 1.0 standard drink of alcohol per week. She reports current drug use. Frequency: 7.00 times per week. Drug: Marijuana.  Allergies: No Known Allergies  (Not in a hospital admission)   No results found for this or any previous visit (from the past 48 hour(s)). No results found.  Review of Systems  Constitutional: Negative.   HENT: Negative.   Eyes: Negative.   Respiratory: Negative.   Cardiovascular: Negative.   Gastrointestinal: Negative.   Musculoskeletal: Negative.   Skin: Negative.   Neurological: Negative.   Psychiatric/Behavioral: Negative.   All other systems reviewed and are negative.   Blood pressure 101/65, pulse 86, temperature 98 F (36.7 C), temperature source Oral, resp. rate 20, height 5\' 6"  (1.676 m), weight 83.9 kg, SpO2 94 %. Physical Exam Vitals and nursing note reviewed.  Constitutional:      Appearance: She is well-developed.  HENT:     Head: Normocephalic and atraumatic.  Eyes:     Conjunctiva/sclera: Conjunctivae normal.     Pupils: Pupils are equal, round, and reactive to light.  Cardiovascular:     Heart sounds: Normal heart sounds.  Pulmonary:     Effort: Pulmonary  effort is normal.  Abdominal:     Palpations: Abdomen is soft.  Musculoskeletal:        General: Normal range of motion.     Cervical back: Normal range of motion.  Skin:    General: Skin is warm and dry.  Neurological:     General: No focal deficit present.     Mental Status: She is alert.  Psychiatric:        Mood and Affect: Mood normal.        Behavior: Behavior normal.        Thought Content: Thought content normal.        Judgment: Judgment normal.      Assessment/Plan Doing well.  Recommend follow-up in about 4 weeks  , MD 08/14/2020, 2:24 PM

## 2020-08-14 NOTE — Procedures (Signed)
[facility] Physician's Interval Evaluation & Treatment Note  Patient Identification: Dana Thompson MRN:  [id number] Date of Evaluation:  08/14/2020 TX #: [id number]  MADRS:   MMSE:   P.E. Findings:  Doing well.  No complaint.  No physical problems.  Psychiatric Interval Note:  Mood good no return of suicidal ideation  Subjective:  Patient is a 20 y.o. female seen for evaluation for Electroconvulsive Therapy. Generally feeling well.  Treatment Summary:   [x]   Right Unilateral             []  Bilateral   % Energy : 0.3 ms 20%   Impedance: 1480 ohms  Seizure Energy Index: 15,191 V squared  Postictal Suppression Index: 61%  Seizure Concordance Index: 95%  Medications  Pre Shock: Zofran 4 mg, Robinul 0.2 mg Toradol 30 mg Brevital 80 mg succinylcholine 150 mg  Post Shock: Versed 2 mg  Seizure Duration: 42 seconds EMG 122 seconds EEG   Comments: Recommend follow-up 4 weeks  Lungs:  [x]   Clear to auscultation               []  Other:   Heart:    [x]   Regular rhythm             []  irregular rhythm    [x]   Previous H&P reviewed, patient examined and there are NO CHANGES                 []   Previous H&P reviewed, patient examined and there are changes noted.   , MD 5/22/20222:25 PM

## 2020-09-02 ENCOUNTER — Encounter: Payer: Self-pay | Admitting: Registered Nurse

## 2020-09-02 ENCOUNTER — Other Ambulatory Visit: Payer: Self-pay

## 2020-09-02 ENCOUNTER — Other Ambulatory Visit: Payer: Self-pay | Admitting: Psychiatry

## 2020-09-02 ENCOUNTER — Encounter
Admission: RE | Admit: 2020-09-02 | Discharge: 2020-09-02 | Disposition: A | Discharge status: Home / Self Care | Payer: 59 | Source: Ambulatory Visit | Attending: Psychiatry | Admitting: Psychiatry

## 2020-09-02 DIAGNOSIS — F339 Major depressive disorder, recurrent, unspecified: Secondary | ICD-10-CM | POA: Insufficient documentation

## 2020-09-02 MED ORDER — METHOHEXITAL SODIUM 0.5 G IJ SOLR
INTRAMUSCULAR | Status: AC
  Filled 2020-09-02: qty 500

## 2020-09-02 MED ORDER — GLYCOPYRROLATE 0.2 MG/ML IJ SOLN: 0.2000 mg | Freq: Once | INTRAMUSCULAR | Status: AC

## 2020-09-02 MED ORDER — KETOROLAC TROMETHAMINE 30 MG/ML IJ SOLN: 30.0000 mg | Freq: Once | INTRAMUSCULAR | Status: AC

## 2020-09-02 MED ORDER — SUCCINYLCHOLINE CHLORIDE 200 MG/10ML IV SOSY
PREFILLED_SYRINGE | INTRAVENOUS | Status: AC
  Filled 2020-09-02: qty 30

## 2020-09-02 MED ORDER — KETOROLAC TROMETHAMINE 30 MG/ML IJ SOLN
INTRAMUSCULAR | Status: AC
  Administered 2020-09-02: 30 mg via INTRAVENOUS
  Filled 2020-09-02: qty 1

## 2020-09-02 MED ORDER — SUCCINYLCHOLINE CHLORIDE 20 MG/ML IJ SOLN
INTRAMUSCULAR | Status: DC | PRN
  Administered 2020-09-02: 100 mg via INTRAVENOUS

## 2020-09-02 MED ORDER — SODIUM CHLORIDE 0.9 % IV SOLN
500.0000 mL | Freq: Once | INTRAVENOUS | Status: AC
  Administered 2020-09-02: 500 mL via INTRAVENOUS

## 2020-09-02 MED ORDER — ONDANSETRON HCL 4 MG/2ML IJ SOLN
INTRAMUSCULAR | Status: AC
  Administered 2020-09-02: 4 mg via INTRAVENOUS
  Filled 2020-09-02: qty 2

## 2020-09-02 MED ORDER — ONDANSETRON HCL 4 MG/2ML IJ SOLN: 4.0000 mg | Freq: Once | INTRAMUSCULAR | Status: AC

## 2020-09-02 MED ORDER — GLYCOPYRROLATE 0.2 MG/ML IJ SOLN
INTRAMUSCULAR | Status: AC
  Administered 2020-09-02: 0.2 mg via INTRAVENOUS
  Filled 2020-09-02: qty 1

## 2020-09-02 MED ORDER — SODIUM CHLORIDE 0.9 % IV SOLN: INTRAVENOUS | Status: DC | PRN

## 2020-09-02 MED ORDER — MIDAZOLAM HCL 2 MG/2ML IJ SOLN
INTRAMUSCULAR | Status: DC | PRN
  Administered 2020-09-02: 2 mg via INTRAVENOUS

## 2020-09-02 MED ORDER — MIDAZOLAM HCL 2 MG/2ML IJ SOLN: 2.0000 mg | Freq: Once | INTRAMUSCULAR | Status: DC

## 2020-09-02 MED ORDER — METHOHEXITAL SODIUM 100 MG/10ML IV SOSY
PREFILLED_SYRINGE | INTRAVENOUS | Status: DC | PRN
  Administered 2020-09-02: 80 mg via INTRAVENOUS

## 2020-09-02 MED ORDER — MIDAZOLAM HCL 2 MG/2ML IJ SOLN
INTRAMUSCULAR | Status: AC
  Filled 2020-09-02: qty 2

## 2020-09-02 NOTE — Anesthesia Postprocedure Evaluation (Signed)
Anesthesia Post Note  Patient: Nautica Hotz  Procedure(s) Performed: ECT TX  Patient location during evaluation: PACU Anesthesia Type: General Level of consciousness: awake and alert Pain management: pain level controlled Vital Signs Assessment: post-procedure vital signs reviewed and stable Respiratory status: spontaneous breathing, nonlabored ventilation and respiratory function stable Cardiovascular status: blood pressure returned to baseline and stable Postop Assessment: no signs of nausea or vomiting Anesthetic complications: no   No notable events documented.   Last Vitals:  Vitals:   09/02/20 1345 09/02/20 1414  BP: 108/63 107/65  Pulse: 68   Resp: 20 18  Temp:  (!) 36.4 C  SpO2:      Last Pain:  Vitals:   09/02/20 1414  TempSrc: Oral  PainSc: 0-No pain                 Chauntelle Azpeitia

## 2020-09-02 NOTE — Anesthesia Procedure Notes (Signed)
Date/Time: 09/02/2020 12:03 PM Performed by: Karoline Caldwell, CRNA Pre-anesthesia Checklist: Patient identified, Emergency Drugs available, Suction available and Patient being monitored Patient Re-evaluated:Patient Re-evaluated prior to induction Oxygen Delivery Method: Circle system utilized Preoxygenation: Pre-oxygenation with 100% oxygen Induction Type: IV induction Ventilation: Mask ventilation without difficulty and Mask ventilation throughout procedure Airway Equipment and Method: Bite block Placement Confirmation: positive ETCO2 Dental Injury: Teeth and Oropharynx as per pre-operative assessment

## 2020-09-02 NOTE — Progress Notes (Signed)
PIV removed without event, tolerated well. Awake/alert pleasant post Ect

## 2020-09-02 NOTE — H&P (Signed)
Dana Thompson is an 20 y.o. female.   Chief Complaint: Patient reports that she has had a couple of episodes of getting depressed recently but currently today her mood is okay HPI: History of recurrent depression currently receiving maintenance ECT  Past Medical History:  Diagnosis Date   ADHD (attention deficit hyperactivity disorder)    Anxiety    Attention deficit disorder    Depression    Irregular heartbeat    Premature Atrial Contractions/ Palpitations   Pre-diabetes    Urinary tract infection    in past, but not currently    History reviewed. No pertinent surgical history.  Family History  Problem Relation Age of Onset   Cancer Maternal Grandmother    Cancer Paternal Grandmother    Hyperlipidemia Father    Hypertension Father    Social History:  reports that she has never smoked. She has never used smokeless tobacco. She reports current alcohol use of about 1.0 standard drink of alcohol per week. She reports current drug use. Frequency: 7.00 times per week. Drug: Marijuana.  Allergies: No Known Allergies  (Not in a hospital admission)   No results found for this or any previous visit (from the past 48 hour(s)). No results found.  Review of Systems  Constitutional: Negative.   HENT: Negative.    Eyes: Negative.   Respiratory: Negative.    Cardiovascular: Negative.   Gastrointestinal: Negative.   Musculoskeletal: Negative.   Skin: Negative.   Neurological: Negative.   Psychiatric/Behavioral:  Positive for dysphoric mood. Negative for agitation, confusion, decreased concentration, hallucinations, self-injury, sleep disturbance and suicidal ideas. The patient is not nervous/anxious and is not hyperactive.    Blood pressure 107/65, pulse 68, temperature (!) 97.5 F (36.4 C), temperature source Oral, resp. rate 18, height 5\' 6"  (1.676 m), weight 83.9 kg, SpO2 95 %. Physical Exam Vitals and nursing note reviewed.  Constitutional:      Appearance: She is  well-developed.  HENT:     Head: Normocephalic and atraumatic.  Eyes:     Conjunctiva/sclera: Conjunctivae normal.     Pupils: Pupils are equal, round, and reactive to light.  Cardiovascular:     Heart sounds: Normal heart sounds.  Pulmonary:     Effort: Pulmonary effort is normal.  Abdominal:     Palpations: Abdomen is soft.  Musculoskeletal:        General: Normal range of motion.     Cervical back: Normal range of motion.  Skin:    General: Skin is warm and dry.  Neurological:     General: No focal deficit present.     Mental Status: She is alert.  Psychiatric:        Mood and Affect: Mood normal.        Thought Content: Thought content normal.     Assessment/Plan Treatment today and follow up with next treatment in a couple weeks since it seems to be wearing off sooner than we had expected  , MD 09/02/2020, 2:45 PM

## 2020-09-02 NOTE — Anesthesia Preprocedure Evaluation (Signed)
Anesthesia Evaluation  Patient identified by MRN, date of birth, ID band Patient awake    Reviewed: Allergy & Precautions, NPO status , Patient's Chart, lab work & pertinent test results  History of Anesthesia Complications Negative for: history of anesthetic complications  Airway Mallampati: II  TM Distance: >3 FB Neck ROM: Full    Dental no notable dental hx.    Pulmonary neg pulmonary ROS, neg sleep apnea, neg COPD,    breath sounds clear to auscultation- rhonchi (-) wheezing      Cardiovascular Exercise Tolerance: Good (-) hypertension(-) CAD and (-) Past MI  Rhythm:Regular Rate:Normal - Systolic murmurs and - Diastolic murmurs    Neuro/Psych PSYCHIATRIC DISORDERS Anxiety Depression negative neurological ROS     GI/Hepatic negative GI ROS, Neg liver ROS,   Endo/Other  negative endocrine ROSneg diabetes  Renal/GU negative Renal ROS     Musculoskeletal negative musculoskeletal ROS (+)   Abdominal (+) - obese,   Peds  Hematology negative hematology ROS (+)   Anesthesia Other Findings Past Medical History: No date: ADHD (attention deficit hyperactivity disorder) No date: Anxiety No date: Attention deficit disorder No date: Depression No date: Irregular heartbeat     Comment:  Premature Atrial Contractions/ Palpitations No date: Pre-diabetes No date: Urinary tract infection     Comment:  in past, but not currently   Reproductive/Obstetrics                             Anesthesia Physical  Anesthesia Plan  ASA: II  Anesthesia Plan: General   Post-op Pain Management:    Induction: Intravenous  PONV Risk Score and Plan: 2 and Ondansetron  Airway Management Planned: Mask  Additional Equipment:   Intra-op Plan:   Post-operative Plan:   Informed Consent: I have reviewed the patients History and Physical, chart, labs and discussed the procedure including the risks, benefits  and alternatives for the proposed anesthesia with the patient or authorized representative who has indicated his/her understanding and acceptance.     Dental advisory given  Plan Discussed with: CRNA and Anesthesiologist  Anesthesia Plan Comments:         Anesthesia Quick Evaluation  

## 2020-09-02 NOTE — Transfer of Care (Signed)
Immediate Anesthesia Transfer of Care Note  Patient: Dana Thompson  Procedure(s) Performed: ECT TX  Patient Location: PACU  Anesthesia Type:General  Level of Consciousness: sedated  Airway & Oxygen Therapy: Patient Spontanous Breathing  Post-op Assessment: Report given to RN and Post -op Vital signs reviewed and stable  Post vital signs: Reviewed and stable  Last Vitals:  Vitals Value Taken Time  BP    Temp    Pulse    Resp    SpO2      Last Pain:  Vitals:   09/02/20 1042  TempSrc:   PainSc: 0-No pain         Complications: No notable events documented.

## 2020-09-06 ENCOUNTER — Other Ambulatory Visit: Payer: Self-pay

## 2020-09-06 ENCOUNTER — Encounter (INDEPENDENT_AMBULATORY_CARE_PROVIDER_SITE_OTHER): Payer: Self-pay | Admitting: "Endocrinology

## 2020-09-06 ENCOUNTER — Ambulatory Visit (INDEPENDENT_AMBULATORY_CARE_PROVIDER_SITE_OTHER): Payer: 59 | Admitting: "Endocrinology

## 2020-09-06 VITALS — BP 118/76 | HR 86 | Wt 198.6 lb

## 2020-09-06 DIAGNOSIS — E063 Autoimmune thyroiditis: Secondary | ICD-10-CM

## 2020-09-06 DIAGNOSIS — R7303 Prediabetes: Secondary | ICD-10-CM | POA: Diagnosis not present

## 2020-09-06 DIAGNOSIS — F322 Major depressive disorder, single episode, severe without psychotic features: Secondary | ICD-10-CM

## 2020-09-06 DIAGNOSIS — E6609 Other obesity due to excess calories: Secondary | ICD-10-CM | POA: Diagnosis not present

## 2020-09-06 DIAGNOSIS — E049 Nontoxic goiter, unspecified: Secondary | ICD-10-CM | POA: Diagnosis not present

## 2020-09-06 DIAGNOSIS — Z6830 Body mass index (BMI) 30.0-30.9, adult: Secondary | ICD-10-CM

## 2020-09-06 LAB — POCT GLYCOSYLATED HEMOGLOBIN (HGB A1C): Hemoglobin A1C: 5.1 % (ref 4.0–5.6)

## 2020-09-06 LAB — POCT GLUCOSE (DEVICE FOR HOME USE): POC Glucose: 95 mg/dl (ref 70–99)

## 2020-09-06 NOTE — Patient Instructions (Signed)
Follow up visit in 4 months.  

## 2020-09-06 NOTE — Progress Notes (Signed)
Subjective:  Subjective  Patient Name: Dana Thompson Date of Birth: 26-May-2000  MRN: [id number]  Dana Thompson) Dana Thompson  presents to the office today for follow up evaluation and management of her pre-diabetes and goiter.  HISTORY OF PRESENT ILLNESS:   Dana Thompson is a 20 y.o. Caucasian young lady.   Dana Thompson was unaccompanied.  1. Dana Thompson's initial pediatric endocrine evaluation occurred on 05/19/14.   A. Perinatal history: Gestational Age: [redacted]w[redacted]d; 5 lb 7 oz (2.466 kg); Healthy newborn, but had meconium staining so she was on antibiotics for 7 days.   B. Infancy: Healthy  C. Childhood: ADHD, for which she took Concerta; anxiety attacks for which she took fluoxetine; several prior strep infections, and possible chicken pox; No surgeries; No allergies to medications; She had some seasonal allergy symptoms and signs each Spring.  D. Chief complaint:   1).  Mom and Dana Thompson had discussed diabetes in the past when mom was in nursing school. Dana Thompson requested that a HbA1c value be drawn in August 2015. Mom reported that the value was 5.7%, c/w pre-diabetes. Follow up non-fasting lab tests on 12/06/13 showed a HbA1c of 5.9%, again c/w prediabetes, glucose of 85, cholesterol 145, triglycerides 54, HDL 55, and LDL 79.   2). Dana Thompson had been at camp for one month prior to the August tests and had been very active, After camp, however, she was fairly sedentary. After the September tests Dana Thompson was referred to Dr. [name] [name], RD, PhD, at the [facility] for nutritional counseling. Lifestyle changes had included reducing her intake of carbs and increasing her physical activity with volleyball and basketball.   E. Pertinent family history:   1). Obesity: Dad was overweight/obese. Mom gained about 40 pounds in the past year. Two paternal uncles were overweight.    2). DM: Maternal great grandmother developed DM at the end of her life.    3). Thyroid disease: None   4). ASCVD: Maternal grandfather had some form of heart  disease. Maternal grandfather also had a stroke due to fungal meningitis acquired in [place].    5). Cancers:  Paternal grandfather had throat CA. Mom's second cousin had brain cancer. Paternal grandmother had breast CA. Paternal grandaunt died of leukemia.   6). Others: Mom was recently diagnosed with fibromyalgia. Other members of her family had fibromyalgia.   F. Lifestyle:   1). Family diet: When Dana Thompson was with mom they ate at restaurants about once a month. When she was with dad they ate at  restaurants about once a week.    2). Physical activities: None  2. Clinical course:  A. During the past 6 years, Dana Thompson has lost weight and gained weight several times, but the overall trend has been upward.  B. Since her initial presentation, her HbA1c values and her TFTs have remained normal.   C.  She had two covid vaccinations in April and May 2021.  D. She had her umbilicus pierced. She has been cleaning the site regularly and it has not been infected.    E. On 04/27/20 she presented to the ED with increasing anxiety and depression and thoughts of self harm. She had just quit her job. She was admitted to the [facility]. She was discharged on 05/03/20. Discharge medications included venlafaxine, Vyvanse, and a Nexplanon implant.    3. Dana Thompson's last [facility] visit occurred on 06/02/20. I had asked her to try to follow our Eat Right Diet, use the [facility] Diet recipes, and exercise more.  A. In the interim she had been fairly healthy physically.   B. On 06/27/20 she had a video visit with a psychiatrist, Dr. Mordecai Rasmussen at [facility] for evaluations for possible ECT. She has since had nine ECTs, most recently on 09/02/20. Her parents feel that she is doing better, but Dana Thompson is not sure. She may have one additional ECT. She is also looking at possible ketamine treatments.   C. She is going to the gym occasionally. She wants to start strength training.   D. She has been  doing more comfort eating when she feels depressed. She tries to be careful with her eating at times, but at other times gives into her cravings, especially when she is with her dad. Dad has the attitude that if you want to eat, eat.   E. She has not had any episodes of thyroid swelling and mild tenderness since her last visit.   F. She now takes Effexor and Rexulti. She is also taking Vyvanse and has less appetite. Her parents administer her medications. She also has a Nexplanon implant.   G. She has not yet found a new PCP.      4. Pertinent Review of Systems:  Constitutional:  A. Dana Thompson feels "fine physically, usually 8/10". She occasionally has headaches. Her energy level is "fine". She still often gets tired at the end of the day. She is sleeping "okay". B. Emotionally things have been "kind of rough". She wants to find a new job, but she finds it very difficult to get motivated to find a job. She still sometimes feels suicidal, but has no intent or plan to harm herself. She knows she needs to see a therapist.   C. She is drinking a lot of caffeine now, especially one Monster per day.  Eyes: Vision is okay. She does have glasses, but rarely wears them.    Neck: As above.   Heart: She rarely has palpitations. She has a past history of PACs and has worn a Holter monitor before. Heart rate increases with exercise or other physical activity. She has no other complaints of irregular heart beats, chest pain, or chest pressure.   Gastrointestinal: Now that she is no longer eating to excess, her abdomen is much less bloated. She has no complaints of excessive belly hunger, reflux, upset stomach, or epigastric pains. Bowel movents are occasionally constipated.  Hands: No tremors unless her BG is low. Legs: There are no complaints of numbness, tingling, burning, or pain. No edema is noted.  Feet: Since she quit her job, she no longer has swelling and pains at the end of the day. There are no complaints of  numbness, tingling, or burning.  Neurologic: There are no recognized problems with muscle movement and strength, sensation, or coordination. GYN: Menarche occurred just before her 58th birthday. Her LMP was about tow weeks ago. She has periods every 1-2 months. She has a Nexplanon implant.   Psych: She is not planning to commit suicide today or any time soon.   PAST MEDICAL, FAMILY, AND SOCIAL HISTORY  Past Medical History:  Diagnosis Date   ADHD (attention deficit hyperactivity disorder)    Anxiety    Attention deficit disorder    Depression    Irregular heartbeat    Premature Atrial Contractions/ Palpitations   Pre-diabetes    Urinary tract infection    in past, but not currently    Family History  Problem Relation Age of Onset   Cancer Maternal Grandmother  Cancer Paternal Grandmother    Hyperlipidemia Father    Hypertension Father      Current Outpatient Medications:    etonogestrel (NEXPLANON) 68 MG IMPL implant, 1 each by Subdermal route once., Disp: , Rfl:    venlafaxine XR (EFFEXOR-XR) 75 MG 24 hr capsule, Take 75 mg by mouth daily., Disp: , Rfl:    VYVANSE 50 MG capsule, Take 50 mg by mouth daily., Disp: , Rfl:    ARIPiprazole (ABILIFY) 5 MG tablet, Take 5 mg by mouth daily. (Patient not taking: Reported on 09/06/2020), Disp: , Rfl:    hydrOXYzine (VISTARIL) 50 MG capsule, Take 50 mg by mouth 3 (three) times daily as needed. (Patient not taking: Reported on 09/06/2020), Disp: , Rfl:    venlafaxine XR (EFFEXOR-XR) 150 MG 24 hr capsule, Take 150 mg by mouth daily with breakfast. (Patient not taking: Reported on 09/06/2020), Disp: , Rfl:   Allergies as of 09/06/2020   (No Known Allergies)     reports that she has never smoked. She has never used smokeless tobacco. She reports current alcohol use of about 1.0 standard drink of alcohol per week. She reports current drug use. Frequency: 7.00 times per week. Drug: Marijuana. Pediatric History  Patient Parents    Hellenbrand,Alex (Father)   Leilani AbleJones,Debra (Mother)   Other Topics Concern   Not on file  Social History Narrative   Lives at home with mom,\ and  step dad  She is working at [employer] as a Production assistant, radioserver full time.     Lives with father now- 06/29/20    1. School and Family: She took a break from college. 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 She is still finding it difficult to look for a new job. Parents are divorced. Analuisa lives with her father now.  2. Activities: As above.  3. Primary Care Provider: None 4. Psychiatrist: Dr. Ellamae SiaKeshavpal Reddy, phone [phone] 6. Therapist: None 7. Health insurance: She has health insurance through her father.  8. Habits: Vaping nicotine and smoking THC. She is not drinking alcohol very often.   REVIEW OF SYSTEMS: There are no other significant problems involving Mahum's other body systems.    Objective:  Objective  Vital Signs:  BP 118/76 (BP Location: Right Arm, Patient Position: Sitting, Cuff Size: Normal)   Pulse 86   Wt 198 lb 9.6 oz (90.1 kg)   BMI 32.05 kg/m     Ht Readings from Last 3 Encounters:  09/02/20 5\' 6"  (1.676 m) (75 %, Z= 0.67)*  08/12/20 5\' 6"  (1.676 m) (75 %, Z= 0.67)*  07/29/20 5\' 6"  (1.676 m) (75 %, Z= 0.67)*   * Growth percentiles are based on CDC (Girls, 2-20 Years) data.   Wt Readings from Last 3 Encounters:  09/06/20 198 lb 9.6 oz (90.1 kg) (97 %, Z= 1.92)*  09/02/20 185 lb (83.9 kg) (95 %, Z= 1.69)*  08/12/20 185 lb (83.9 kg) (95 %, Z= 1.69)*   * Growth percentiles are based on CDC (Girls, 2-20 Years) data.   HC Readings from Last 3 Encounters:  [facility]   Body surface area is 2.05 meters squared. No height on file for this encounter. 97 %ile (Z= 1.92) based on CDC (Girls, 2-20 Years) weight-for-age data using vitals from 09/06/2020.    PHYSICAL EXAM:  Constitutional: Aashka appears healthy, but more obese. Her height percentile has plateaued. Her weight has increased 16 pounds. [name]  was alert and bright. Her flow of speech  was normal. Her affect and insight seemed fairly normal, but somewhat dysthymic. She is very thoughtful. She does not appear suicidal at all.  Head: The head is normocephalic. Face: The face appears normal. There are no obvious dysmorphic features. Eyes: The eyes appear to be normally formed and spaced. Gaze is conjugate. There is no obvious arcus or proptosis. Moisture appears normal. Ears: The ears are normally placed and appear externally normal. Mouth: The oropharynx and tongue appear normal. Dentition appears to be normal for age. Oral moisture is normal. There is no mucosal hyperpigmentation. She has no tongue tremor.  Neck: The neck appears to be visibly full today. No carotid bruits are noted. The thyroid gland is mildly enlarged at about 21 grams in size,. The right lobe is only slightly enlarged, but the left lobe is larger.  The thyroid gland is not tender to palpation today. She has no acanthosis. Lungs: The lungs are clear to auscultation. Air movement is good. Heart: Heart rate and rhythm are regular. Heart sounds S1 and S2 are normal. I did not appreciate any pathologic cardiac murmurs. Abdomen: The abdomen is larger and soft. smaller. Bowel sounds are normal. There is no obvious hepatomegaly, splenomegaly, or other mass effect. There is no abdominal tenderness.  Arms: Muscle size and bulk are normal for age. Hands: She has no tremor today. Phalangeal and metacarpophalangeal joints are normal. Palmar muscles are normal for age. Palmar skin is normal. Palmar moisture is also normal. There is no palmar hyperpigmentation.  Legs: Muscles appear normal for age. No edema is present.  Neurologic: Strength is normal for age in both the upper and lower extremities. Muscle tone is normal. Sensation to touch is normal in both legs.    LAB DATA:   Results for orders placed or performed in visit on 09/06/20 (from the past 672 hour(s))  POCT Glucose (Device  for Home Use)   Collection Time: 09/06/20  2:18 PM  Result Value Ref Range   Glucose Fasting, POC     POC Glucose 95 70 - 99 mg/dl  POCT glycosylated hemoglobin (Hb A1C)   Collection Time: 09/06/20  2:24 PM  Result Value Ref Range   Hemoglobin A1C 5.1 4.0 - 5.6 %   HbA1c POC (<> result, manual entry)     HbA1c, POC (prediabetic range)     HbA1c, POC (controlled diabetic range)     Labs 09/06/20: HbA1c 5.1%, CBG 95  Labs 06/29/20: CMP normal; CBC normal; urinalysis normal, except moderate hemoglobin   Labs 06/02/20: Fasting CBG was 108.  Labs 05/03/20: HbA1c 5.5%; TSH 1.558; cholesterol 185, triglycerides 67, HDL 45, LDL 127  Labs 04/27/20: CMP normal, except CO2 20 (ref 22-32); CBC normal; beta hCG <5  Labs 02/01/20: HbA1c 5.6%; TSH 0.87, free T4 1.1, free  T3 3.0, thyroglobulin antibody <1, TPO antibody 2 (ref <9)  Labs 10/30/19: TSH 0.70, free T4 1.2, free T3 3.2  Labs 10/29/18: HbA1c 5.3%, CBG 131  Labs 02/13/18: HbA1c 5.4%, CBG 96; TSH 0.62, free T4 1.1, free T3 3.1, TPO antibody 1, thyroglobulin antibody <1  Labs 11/06/17:  HbA1c 5.4%; TSH 0.64, free T4 1.1, free T3 2.8  Labs 08/15/17: HbA1c 5.5%, CBG 114  Labs  05/01/17: Cholesterol 187, triglycerides 82, HDL 70, LDL 101  Labs 02/04/17: HbA1c 5.6%; TSH 1.796; cholesterol 192, triglycerides 67, HDL 66, LDL 113  Labs 02/01/17: CBC normal, CMP normal  Labs 07/26/16: HbA1c 5.4%  Labs 01/20/16: TSH 0.71, free T4 1.1, free T3 2.9  Labs  12/12/15: HbA1c 5.3%; TSH 1.70, free T4 1.0, free T3 3.0, TPO 4 (ref <9)   Labs 07/11/15: HbA1c 5.3%  Labs 07/05/15: TSH 2.21, free T4 0.9 (normal 0.80-1.40), free T3 2.1 (normal 3.0-4.7)   Labs 01/10/15: HbA1c 5.3%.  Labs 09/07/14: HbA1c 5.4%.   Labs 05/19/14: HbA1c 5.4%; TSH 1.336, free T4 0.91, free T3 3.2, anti-thyroglobulin antibody <1 (normal < 2), TPO antibody 2 (normal < 9); C-peptide 1.05    Assessment and Plan:  Assessment  ASSESSMENT:  1-4. Elevated HbA1c/History of  pre-diabetes/impaired glucose tolerance/overweight:   A. Kirsten's HbA1c values in August and September 2015 were definitely in the prediabetes range. At those times her BMI was at or slightly above the 85%, c/w being overweight.   B. When she was relatively overweight, the amount of cytokines produced by her "overly fat" adipose cells was excessive, she had more insulin resistance, and compensatory hyperinsulinemia. Despite the hyperinsulinemia, however, her HbA1c increased into the prediabetes range.   C. After making some changes in what she ate and how much she ate, and playing volleyball, her weight had decreased to the 76.3% and her HbA1c had decreased to the 5.3% range in October 2016. Thereafter her weight and HbA1c fluctuated.    D. At her visit in May 2019 she had lost more weight, but her HbA1c had increased again. At her August 2019 visit she had re-gained some weight, but her HbA1c was lower. At her visit in August 2020, her weight has increased, but her HbA1c was lower.   E. At her visit in November 2021, her weight had increased into the obesity zone. Her HbA1c was 5.6%, at the upper limit of normal glucose tolerance.   F. At her visit in March 2022, she had lost 10 pounds. Her HbA1c in February was 5.5%. Today in June 2022 she has gained 16 pounds in weight, but her HbA1c is much lower.    5-6. Goiter/thyroiditis:   A. Iniya has had waxing and waning of her thyroid gland size over time, c/w evolving Hashimoto's thyroiditis. She has also had episodic thyroid gland tenderness, also c/w evolving Hashimoto's thyroiditis. She has not had any more flare ups since her last visit.   B. Although there is no known family history of thyroid disease, the incidence and prevalence of Hashimoto's Dz is increasing not only in families, but in sporadic cases. If mom's fibromyalgia is indeed an autoimmune disease, as many experts believe, then the presence of autoimmunity is in the family genetics. At Breahna's age,  Hashimoto's thyroiditis is the most frequent cause of goiter.  C. Kemper's antibody tests for Hashimoto's disease have remained negative. The negative antibodies do not, however, rule out Hashimoto's Dz, since Hashimoto's Dz is a T-lymphocyte-mediated phenomenon and antibodies are produced by B cells. These two groups of lymphocytes often do not act in synchrony.  D. Tayonna's TFTs have remained within normal limits, but she has had fluctuations of the TFTs that were c/w episodic thyroiditis.    E. Chenel's thyroiditis was clinically quiescent at her visit in August 2020, but was clinically active today in November 2020. Her thyroiditis was active in March 2022, but quiescent in June 2022.  F. Her TSH in February 2022 was mid-euthyroid. She is clinically euthyroid today.  7. Acanthosis nigricans: This condition has resolved. 8. Palpitations: Resolved   9. Fatigue: Her fatigue is better 10. Major depressive disorder: She looks fairly good today, but subjectively is not doing as well as she was doing in 2021.  She has had some "fleeting" suicidal thoughts in the recent weeks, but is not actively planning to commit suicide.  11. Tremor: Resolved. 12. Feet pains: Resolved.   PLAN:  1. Diagnostic: CBG today. Repeat TFTs in early 2023. .   2. Therapeutic: Eat Right Diet, [facility] Diet as a source of menus to operationalize the Eat Right Diet. Plan her meals and the exercise.  3. Patient education: We discussed the issues of overweight, glucose intolerance, goiter, and thyroiditis. We again discussed the likelihood that she will become permanently hypothyroid. We also discussed the adverse effects of obesity. She told me that she wants to continue to see me because she feels comfortable with me.   4. Follow-up: 4 months    Level of Service: This visit lasted in excess of 55 minutes. More than 50% of the visit was devoted to counseling the family   Molli Knock, MD, CDE Pediatric and Adult  Endocrinology

## 2020-09-15 ENCOUNTER — Other Ambulatory Visit: Payer: Self-pay | Admitting: Psychiatry

## 2020-09-16 ENCOUNTER — Encounter (HOSPITAL_BASED_OUTPATIENT_CLINIC_OR_DEPARTMENT_OTHER)
Admission: RE | Admit: 2020-09-16 | Discharge: 2020-09-16 | Disposition: A | Discharge status: Home / Self Care | Payer: 59 | Source: Ambulatory Visit | Attending: Psychiatry | Admitting: Psychiatry

## 2020-09-16 ENCOUNTER — Other Ambulatory Visit: Payer: Self-pay

## 2020-09-16 DIAGNOSIS — F332 Major depressive disorder, recurrent severe without psychotic features: Secondary | ICD-10-CM | POA: Diagnosis not present

## 2020-09-16 DIAGNOSIS — F339 Major depressive disorder, recurrent, unspecified: Secondary | ICD-10-CM | POA: Diagnosis not present

## 2020-09-16 MED ORDER — ONDANSETRON HCL 4 MG/2ML IJ SOLN: 4.0000 mg | Freq: Once | INTRAMUSCULAR | Status: DC | PRN

## 2020-09-16 MED ORDER — METHOHEXITAL SODIUM 100 MG/10ML IV SOSY
PREFILLED_SYRINGE | INTRAVENOUS | Status: DC | PRN
  Administered 2020-09-16: 80 mg via INTRAVENOUS

## 2020-09-16 MED ORDER — SUCCINYLCHOLINE CHLORIDE 20 MG/ML IJ SOLN
INTRAMUSCULAR | Status: DC | PRN
  Administered 2020-09-16: 100 mg via INTRAVENOUS

## 2020-09-16 MED ORDER — MIDAZOLAM HCL 2 MG/2ML IJ SOLN
INTRAMUSCULAR | Status: AC
  Filled 2020-09-16: qty 2

## 2020-09-16 MED ORDER — MEPERIDINE HCL 25 MG/ML IJ SOLN: 6.2500 mg | INTRAMUSCULAR | Status: DC | PRN

## 2020-09-16 MED ORDER — KETOROLAC TROMETHAMINE 30 MG/ML IJ SOLN
INTRAMUSCULAR | Status: AC
  Administered 2020-09-16: 30 mg via INTRAVENOUS
  Filled 2020-09-16: qty 1

## 2020-09-16 MED ORDER — ONDANSETRON HCL 4 MG/2ML IJ SOLN
INTRAMUSCULAR | Status: AC
  Administered 2020-09-16: 4 mg via INTRAVENOUS
  Filled 2020-09-16: qty 2

## 2020-09-16 MED ORDER — GLYCOPYRROLATE 0.2 MG/ML IJ SOLN: 0.2000 mg | Freq: Once | INTRAMUSCULAR | Status: AC

## 2020-09-16 MED ORDER — KETOROLAC TROMETHAMINE 30 MG/ML IJ SOLN: 30.0000 mg | Freq: Once | INTRAMUSCULAR | Status: AC

## 2020-09-16 MED ORDER — FENTANYL CITRATE (PF) 100 MCG/2ML IJ SOLN: 25.0000 ug | INTRAMUSCULAR | Status: DC | PRN

## 2020-09-16 MED ORDER — SUCCINYLCHOLINE CHLORIDE 200 MG/10ML IV SOSY
PREFILLED_SYRINGE | INTRAVENOUS | Status: AC
  Filled 2020-09-16: qty 10

## 2020-09-16 MED ORDER — GLYCOPYRROLATE 0.2 MG/ML IJ SOLN
INTRAMUSCULAR | Status: AC
  Administered 2020-09-16: 0.2 mg via INTRAVENOUS
  Filled 2020-09-16: qty 1

## 2020-09-16 MED ORDER — SUCCINYLCHOLINE CHLORIDE 200 MG/10ML IV SOSY
PREFILLED_SYRINGE | INTRAVENOUS | Status: AC
  Filled 2020-09-16: qty 20

## 2020-09-16 MED ORDER — SODIUM CHLORIDE 0.9 % IV SOLN
500.0000 mL | Freq: Once | INTRAVENOUS | Status: AC
  Administered 2020-09-16: 500 mL via INTRAVENOUS

## 2020-09-16 MED ORDER — METHOHEXITAL SODIUM 0.5 G IJ SOLR
INTRAMUSCULAR | Status: AC
  Filled 2020-09-16: qty 500

## 2020-09-16 MED ORDER — ONDANSETRON HCL 4 MG/2ML IJ SOLN: 4.0000 mg | Freq: Once | INTRAMUSCULAR | Status: AC

## 2020-09-16 MED ORDER — MIDAZOLAM HCL 2 MG/2ML IJ SOLN: 2.0000 mg | Freq: Once | INTRAMUSCULAR | Status: DC

## 2020-09-16 MED ORDER — MIDAZOLAM HCL 2 MG/2ML IJ SOLN
INTRAMUSCULAR | Status: DC | PRN
  Administered 2020-09-16: 2 mg via INTRAVENOUS

## 2020-09-16 MED ORDER — SODIUM CHLORIDE 0.9 % IV SOLN: INTRAVENOUS | Status: DC | PRN

## 2020-09-16 NOTE — Procedures (Signed)
ECT SERVICES Physician's Interval Evaluation & Treatment Note  Patient Identification: Dana Thompson MRN:  [id number] Date of Evaluation:  09/16/2020 TX #: [id number]  MADRS:   MMSE:   P.E. Findings:  No change to physical exam  Psychiatric Interval Note:  Mood stable no worse nor better  Subjective:  Patient is a 20 y.o. female seen for evaluation for Electroconvulsive Therapy. Feeling okay not severely depressed  Treatment Summary:   [x]   Right Unilateral             []  Bilateral   % Energy : 0.3 ms, 20%   Impedance: 1820 ohms  Seizure Energy Index: No radiating  Postictal Suppression Index: No reading  Seizure Concordance Index: No reading  Medications  Pre Shock: Zofran 4 mg Robinul 0.2 mg Toradol 30 mg Brevital 80 mg succinylcholine 150 mg  Post Shock: Versed 2 mg  Seizure Duration: 41 seconds EMG 151 seconds EEG   Comments: Poor reading by the computer but robust and affective seeming seizure.  Patient declines continued treatment at this point but will know to contact if she changes her mind  Lungs:  [x]   Clear to auscultation               []  Other:   Heart:    [x]   Regular rhythm             []  irregular rhythm    [x]   Previous H&P reviewed, patient examined and there are NO CHANGES                 []   Previous H&P reviewed, patient examined and there are changes noted.   , MD 6/24/20224:03 PM

## 2020-09-16 NOTE — H&P (Signed)
Dana Thompson is an 20 y.o. female.   Chief Complaint: No new complaint.  She feels that she gets some memory impairment from ECT and is not feeling like she is having significant consistent improvement HPI: History of recurrent severe depression.  We had felt like she was having some improvement with ECT but she is always remained uncertain  Past Medical History:  Diagnosis Date   ADHD (attention deficit hyperactivity disorder)    Anxiety    Attention deficit disorder    Depression    Irregular heartbeat    Premature Atrial Contractions/ Palpitations   Pre-diabetes    Urinary tract infection    in past, but not currently    History reviewed. No pertinent surgical history.  Family History  Problem Relation Age of Onset   Cancer Maternal Grandmother    Cancer Paternal Grandmother    Hyperlipidemia Father    Hypertension Father    Social History:  reports that she has never smoked. She has never used smokeless tobacco. She reports current alcohol use of about 1.0 standard drink of alcohol per week. She reports current drug use. Frequency: 7.00 times per week. Drug: Marijuana.  Allergies: No Known Allergies  (Not in a hospital admission)   No results found for this or any previous visit (from the past 48 hour(s)). No results found.  Review of Systems  Constitutional: Negative.   HENT: Negative.    Eyes: Negative.   Respiratory: Negative.    Cardiovascular: Negative.   Gastrointestinal: Negative.   Musculoskeletal: Negative.   Skin: Negative.   Neurological: Negative.   Psychiatric/Behavioral: Negative.     Blood pressure 107/80, pulse 62, temperature 97.9 F (36.6 C), temperature source Oral, resp. rate 20, height 5\' 6"  (1.676 m), weight 90.1 kg, SpO2 100 %. Physical Exam Vitals and nursing note reviewed.  Constitutional:      Appearance: She is well-developed.  HENT:     Head: Normocephalic and atraumatic.  Eyes:     Conjunctiva/sclera: Conjunctivae normal.      Pupils: Pupils are equal, round, and reactive to light.  Cardiovascular:     Heart sounds: Normal heart sounds.  Pulmonary:     Effort: Pulmonary effort is normal.  Abdominal:     Palpations: Abdomen is soft.  Musculoskeletal:        General: Normal range of motion.     Cervical back: Normal range of motion.  Skin:    General: Skin is warm and dry.  Neurological:     Mental Status: She is alert.  Psychiatric:        Mood and Affect: Mood normal.     Assessment/Plan Discussed with patient risks and benefits and options.  She elects that she would like to proceed with treatment today but then discontinue further maintenance treatment plans for now.  She and her family are aware that they can get back in touch with at any time to reconsider this plan.  [place], MD 09/16/2020, 3:56 PM

## 2020-09-16 NOTE — Transfer of Care (Signed)
Immediate Anesthesia Transfer of Care Note  Patient: Dana Thompson  Procedure(s) Performed: ECT TX  Patient Location: PACU  Anesthesia Type:General  Level of Consciousness: drowsy  Airway & Oxygen Therapy: Patient Spontanous Breathing and Patient connected to face mask oxygen  Post-op Assessment: Report given to RN and Post -op Vital signs reviewed and stable  Post vital signs: Reviewed and stable  Last Vitals:  Vitals Value Taken Time  BP 118/74   Temp    Pulse 104   Resp 17   SpO2 91     Last Pain:  Vitals:   09/16/20 1112  TempSrc:   PainSc: 0-No pain         Complications: No notable events documented.

## 2020-09-16 NOTE — Anesthesia Procedure Notes (Signed)
Procedure Name: General with mask airway Date/Time: 09/16/2020 12:45 PM Performed by: Joanette Gula, Caila Cirelli, CRNA Pre-anesthesia Checklist: Patient identified, Emergency Drugs available, Suction available, Patient being monitored and Timeout performed Patient Re-evaluated:Patient Re-evaluated prior to induction Oxygen Delivery Method: Circle system utilized Preoxygenation: Pre-oxygenation with 100% oxygen Induction Type: IV induction Ventilation: Mask ventilation throughout procedure

## 2020-09-16 NOTE — Anesthesia Preprocedure Evaluation (Signed)
Anesthesia Evaluation  Patient identified by MRN, date of birth, ID band Patient awake    Reviewed: Allergy & Precautions, NPO status , Patient's Chart, lab work & pertinent test results  History of Anesthesia Complications Negative for: history of anesthetic complications  Airway Mallampati: II  TM Distance: >3 FB Neck ROM: Full    Dental no notable dental hx.    Pulmonary neg pulmonary ROS, neg sleep apnea, neg COPD,    breath sounds clear to auscultation- rhonchi (-) wheezing      Cardiovascular Exercise Tolerance: Good (-) hypertension(-) CAD and (-) Past MI  Rhythm:Regular Rate:Normal - Systolic murmurs and - Diastolic murmurs    Neuro/Psych PSYCHIATRIC DISORDERS Anxiety Depression negative neurological ROS     GI/Hepatic negative GI ROS, Neg liver ROS,   Endo/Other  negative endocrine ROSneg diabetes  Renal/GU negative Renal ROS     Musculoskeletal negative musculoskeletal ROS (+)   Abdominal (+) - obese,   Peds  Hematology negative hematology ROS (+)   Anesthesia Other Findings Past Medical History: No date: ADHD (attention deficit hyperactivity disorder) No date: Anxiety No date: Attention deficit disorder No date: Depression No date: Irregular heartbeat     Comment:  Premature Atrial Contractions/ Palpitations No date: Pre-diabetes No date: Urinary tract infection     Comment:  in past, but not currently   Reproductive/Obstetrics                             Anesthesia Physical  Anesthesia Plan  ASA: II  Anesthesia Plan: General   Post-op Pain Management:    Induction: Intravenous  PONV Risk Score and Plan: 2 and Ondansetron  Airway Management Planned: Mask  Additional Equipment:   Intra-op Plan:   Post-operative Plan:   Informed Consent: I have reviewed the patients History and Physical, chart, labs and discussed the procedure including the risks, benefits  and alternatives for the proposed anesthesia with the patient or authorized representative who has indicated his/her understanding and acceptance.     Dental advisory given  Plan Discussed with: CRNA and Anesthesiologist  Anesthesia Plan Comments:         Anesthesia Quick Evaluation

## 2020-09-16 NOTE — Anesthesia Postprocedure Evaluation (Signed)
Anesthesia Post Note  Patient: Dana Thompson  Procedure(s) Performed: ECT TX  Patient location during evaluation: PACU Anesthesia Type: General Level of consciousness: awake and alert, awake and oriented Pain management: pain level controlled Vital Signs Assessment: post-procedure vital signs reviewed and stable Respiratory status: spontaneous breathing, nonlabored ventilation and respiratory function stable Cardiovascular status: blood pressure returned to baseline and stable Postop Assessment: no apparent nausea or vomiting Anesthetic complications: no   No notable events documented.   Last Vitals:  Vitals:   09/16/20 1310 09/16/20 1320  BP: 103/65 105/75  Pulse: 82 63  Resp: 12 (!) 22  Temp:  36.6 C  SpO2: 99% 100%    Last Pain:  Vitals:   09/16/20 1325  TempSrc:   PainSc: 0-No pain                 Manfred Arch

## 2021-01-17 ENCOUNTER — Ambulatory Visit (INDEPENDENT_AMBULATORY_CARE_PROVIDER_SITE_OTHER): Payer: 59 | Admitting: "Endocrinology

## 2021-01-17 NOTE — Progress Notes (Deleted)
Subjective:  Subjective  Patient Name: Dana Thompson Date of Birth: 2000-04-08  MRN: [id number]  Dana Thompson  presents to the office today for follow up evaluation and management of her pre-diabetes and goiter.  HISTORY OF PRESENT ILLNESS:   Dana Thompson is a 20 y.o. Caucasian young lady.   Dana Thompson was unaccompanied.  1. Dana Thompson's initial pediatric endocrine evaluation occurred on 05/19/14.   A. Perinatal history: Gestational Age: [redacted]w[redacted]d; 5 lb 7 oz (2.466 kg); Healthy newborn, but had meconium staining so she was on antibiotics for 7 days.   B. Infancy: Healthy  C. Childhood: ADHD, for which she took Concerta; anxiety attacks for which she took fluoxetine; several prior strep infections, and possible chicken pox; No surgeries; No allergies to medications; She had some seasonal allergy symptoms and signs each Spring.  D. Chief complaint:   1).  Mom and Dana Thompson had discussed diabetes in the past when mom was in nursing school. Dana Thompson requested that a HbA1c value be drawn in August 2015. Mom reported that the value was 5.7%, c/w pre-diabetes. Follow up non-fasting lab tests on 12/06/13 showed a HbA1c of 5.9%, again c/w prediabetes, glucose of 85, cholesterol 145, triglycerides 54, HDL 55, and LDL 79.   2). Regene had been at camp for one month prior to the August tests and had been very active, After camp, however, she was fairly sedentary. After the September tests Dana Thompson was referred to Dr. [name] Thompson, RD, PhD, at the [facility] for nutritional counseling. Lifestyle changes had included reducing her intake of carbs and increasing her physical activity with volleyball and basketball.   E. Pertinent family history:   1). Obesity: Dad was overweight/obese. Mom gained about 40 pounds in the past year. Two paternal uncles were overweight.    2). DM: Maternal great grandmother developed DM at the end of her life.    3). Thyroid disease: None   4). ASCVD: Maternal grandfather had some form of heart  disease. Maternal grandfather also had a stroke due to fungal meningitis acquired in [place].    5). Cancers:  Paternal grandfather had throat CA. Mom's second cousin had brain cancer. Paternal grandmother had breast CA. Paternal grandaunt died of leukemia.   6). Others: Mom was recently diagnosed with fibromyalgia. Other members of her family had fibromyalgia.   F. Lifestyle:   1). Family diet: When Nadean was with mom they ate at restaurants about once a month. When she was with dad they ate at  restaurants about once a week.    2). Physical activities: None  2. Clinical course:  A. During the past 6 years, Dana Thompson has lost weight and gained weight several times, but the overall trend has been upward.  B. Since her initial presentation, her HbA1c values and her TFTs have remained normal.   C.  She had two covid vaccinations in April and May 2021.  D. She had her umbilicus pierced. She has been cleaning the site regularly and it has not been infected.    E. On 04/27/20 she presented to the ED with increasing anxiety and depression and thoughts of self harm. She had just quit her job. She was admitted to the [facility]. She was discharged on 05/03/20. Discharge medications included venlafaxine, Vyvanse, and a Nexplanon implant.    3. Dana Thompson's last [facility] visit occurred on 09/06/20. I had asked her to try to follow our Eat Right Diet, use the [facility] Diet recipes, and exercise more.  A. In the interim she had been fairly healthy physically.   B. On 06/27/20 she had a video visit with a psychiatrist, Dr. Mordecai Rasmussen at [facility] for evaluations for possible ECT. She has since had nine ECTs, most recently on 09/02/20. Her parents feel that she is doing better, but Dana Thompson is not sure. She may have one additional ECT. She is also looking at possible ketamine treatments.   C. She is going to the gym occasionally. She wants to start strength training.   D. She has been  doing more comfort eating when she feels depressed. She tries to be careful with her eating at times, but at other times gives into her cravings, especially when she is with her dad. Dad has the attitude that if you want to eat, eat.   E. She has not had any episodes of thyroid swelling and mild tenderness since her last visit.   F. She now takes Effexor and Rexulti. She is also taking Vyvanse and has less appetite. Her parents administer her medications. She also has a Nexplanon implant.   G. She has not yet found a new PCP.      4. Pertinent Review of Systems:  Constitutional:  A. Dana Thompson feels "fine physically, usually 8/10". She occasionally has headaches. Her energy level is "fine". She still often gets tired at the end of the day. She is sleeping "okay". B. Emotionally things have been "kind of rough". She wants to find a new job, but she finds it very difficult to get motivated to find a job. She still sometimes feels suicidal, but has no intent or plan to harm herself. She knows she needs to see a therapist.   C. She is drinking a lot of caffeine now, especially one Monster per day.  Eyes: Vision is okay. She does have glasses, but rarely wears them.    Neck: As above.   Heart: She rarely has palpitations. She has a past history of PACs and has worn a Holter monitor before. Heart rate increases with exercise or other physical activity. She has no other complaints of irregular heart beats, chest pain, or chest pressure.   Gastrointestinal: Now that she is no longer eating to excess, her abdomen is much less bloated. She has no complaints of excessive belly hunger, reflux, upset stomach, or epigastric pains. Bowel movents are occasionally constipated.  Hands: No tremors unless her BG is low. Legs: There are no complaints of numbness, tingling, burning, or pain. No edema is noted.  Feet: Since she quit her job, she no longer has swelling and pains at the end of the day. There are no complaints of  numbness, tingling, or burning.  Neurologic: There are no recognized problems with muscle movement and strength, sensation, or coordination. GYN: Menarche occurred just before her 58th birthday. Her LMP was about tow weeks ago. She has periods every 1-2 months. She has a Nexplanon implant.   Psych: She is not planning to commit suicide today or any time soon.   PAST MEDICAL, FAMILY, AND SOCIAL HISTORY  Past Medical History:  Diagnosis Date   ADHD (attention deficit hyperactivity disorder)    Anxiety    Attention deficit disorder    Depression    Irregular heartbeat    Premature Atrial Contractions/ Palpitations   Pre-diabetes    Urinary tract infection    in past, but not currently    Family History  Problem Relation Age of Onset   Cancer Maternal Grandmother  Cancer Paternal Grandmother    Hyperlipidemia Father    Hypertension Father      Current Outpatient Medications:    ARIPiprazole (ABILIFY) 5 MG tablet, Take 5 mg by mouth daily., Disp: , Rfl:    etonogestrel (NEXPLANON) 68 MG IMPL implant, 1 each by Subdermal route once., Disp: , Rfl:    hydrOXYzine (VISTARIL) 50 MG capsule, Take 50 mg by mouth 3 (three) times daily as needed., Disp: , Rfl:    venlafaxine XR (EFFEXOR-XR) 150 MG 24 hr capsule, Take 150 mg by mouth daily with breakfast., Disp: , Rfl:    venlafaxine XR (EFFEXOR-XR) 75 MG 24 hr capsule, Take 75 mg by mouth daily., Disp: , Rfl:    VYVANSE 50 MG capsule, Take 50 mg by mouth daily., Disp: , Rfl:   Allergies as of 01/17/2021   (No Known Allergies)     reports that she has never smoked. She has never used smokeless tobacco. She reports current alcohol use of about 1.0 standard drink per week. She reports current drug use. Frequency: 7.00 times per week. Drug: Marijuana. Pediatric History  Patient Parents   Rambert,Alex (Father)   Leilani Able (Mother)   Other Topics Concern   Not on file  Social History Narrative   Lives at home with mom,\ and  step dad   She is working at [employer] as a Production assistant, radio full time.     Lives with father now- 06/29/20    1. School and Family: She took a break from college. 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 She is still finding it difficult to look for a new job. Parents are divorced. Shir lives with her father now.  2. Activities: As above.  3. Primary Care Provider: None 4. Psychiatrist: Dr. Ellamae Sia, phone [phone] 6. Therapist: None 7. Health insurance: She has health insurance through her father.  8. Habits: Vaping nicotine and smoking THC. She is not drinking alcohol very often.   REVIEW OF SYSTEMS: There are no other significant problems involving Dana Thompson's other body systems.    Objective:  Objective  Vital Signs:  There were no vitals taken for this visit.    Ht Readings from Last 3 Encounters:  04/27/20 5\' 6"  (1.676 m) (75 %, Z= 0.67)*  10/29/18 5' 6.54" (1.69 m) (82 %, Z= 0.91)*  02/13/18 5' 6.22" (1.682 m) (79 %, Z= 0.80)*   * Growth percentiles are based on CDC (Girls, 2-20 Years) data.   Wt Readings from Last 3 Encounters:  09/06/20 198 lb 9.6 oz (90.1 kg) (97 %, Z= 1.92)*  06/02/20 182 lb (82.6 kg) (95 %, Z= 1.64)*  04/27/20 177 lb (80.3 kg) (94 %, Z= 1.54)*   * Growth percentiles are based on CDC (Girls, 2-20 Years) data.   HC Readings from Last 3 Encounters:  No data found for Dana Thompson   There is no height or weight on file to calculate BSA. Facility age limit for growth percentiles is 20 years. Facility age limit for growth percentiles is 20 years.    PHYSICAL EXAM:  Constitutional: Dana Thompson appears healthy, but more obese. Her height percentile has plateaued. Her weight has increased 16 pounds. Dana Thompson was alert and bright. Her flow of speech was normal. Her affect and insight seemed fairly normal, but somewhat dysthymic. She is very thoughtful. She does not appear suicidal at all.  Head: The head is normocephalic. Face: The face appears normal. There are no  obvious dysmorphic features. Eyes: The eyes appear  to be normally formed and spaced. Gaze is conjugate. There is no obvious arcus or proptosis. Moisture appears normal. Ears: The ears are normally placed and appear externally normal. Mouth: The oropharynx and tongue appear normal. Dentition appears to be normal for age. Oral moisture is normal. There is no mucosal hyperpigmentation. She has no tongue tremor.  Neck: The neck appears to be visibly full today. No carotid bruits are noted. The thyroid gland is mildly enlarged at about 21 grams in size,. The right lobe is only slightly enlarged, but the left lobe is larger.  The thyroid gland is not tender to palpation today. She has no acanthosis. Lungs: The lungs are clear to auscultation. Air movement is good. Heart: Heart rate and rhythm are regular. Heart sounds S1 and S2 are normal. I did not appreciate any pathologic cardiac murmurs. Abdomen: The abdomen is larger and soft. smaller. Bowel sounds are normal. There is no obvious hepatomegaly, splenomegaly, or other mass effect. There is no abdominal tenderness.  Arms: Muscle size and bulk are normal for age. Hands: She has no tremor today. Phalangeal and metacarpophalangeal joints are normal. Palmar muscles are normal for age. Palmar skin is normal. Palmar moisture is also normal. There is no palmar hyperpigmentation.  Legs: Muscles appear normal for age. No edema is present.  Neurologic: Strength is normal for age in both the upper and lower extremities. Muscle tone is normal. Sensation to touch is normal in both legs.    LAB DATA:   No results found for this or any previous visit (from the past 672 hour(s)).  Labs 09/06/20: HbA1c 5.1%, CBG 95  Labs 06/29/20: CMP normal; CBC normal; urinalysis normal, except moderate hemoglobin   Labs 06/02/20: Fasting CBG was 108.  Labs 05/03/20: HbA1c 5.5%; TSH 1.558; cholesterol 185, triglycerides 67, HDL 45, LDL 127  Labs 04/27/20: CMP normal, except CO2 20  (ref 22-32); CBC normal; beta hCG <5  Labs 02/01/20: HbA1c 5.6%; TSH 0.87, free T4 1.1, free  T3 3.0, thyroglobulin antibody <1, TPO antibody 2 (ref <9)  Labs 10/30/19: TSH 0.70, free T4 1.2, free T3 3.2  Labs 10/29/18: HbA1c 5.3%, CBG 131  Labs 02/13/18: HbA1c 5.4%, CBG 96; TSH 0.62, free T4 1.1, free T3 3.1, TPO antibody 1, thyroglobulin antibody <1  Labs 11/06/17:  HbA1c 5.4%; TSH 0.64, free T4 1.1, free T3 2.8  Labs 08/15/17: HbA1c 5.5%, CBG 114  Labs  05/01/17: Cholesterol 187, triglycerides 82, HDL 70, LDL 101  Labs 02/04/17: HbA1c 5.6%; TSH 1.796; cholesterol 192, triglycerides 67, HDL 66, LDL 113  Labs 02/01/17: CBC normal, CMP normal  Labs 07/26/16: HbA1c 5.4%  Labs 01/20/16: TSH 0.71, free T4 1.1, free T3 2.9  Labs 12/12/15: HbA1c 5.3%; TSH 1.70, free T4 1.0, free T3 3.0, TPO 4 (ref <9)   Labs 07/11/15: HbA1c 5.3%  Labs 07/05/15: TSH 2.21, free T4 0.9 (normal 0.80-1.40), free T3 2.1 (normal 3.0-4.7)   Labs 01/10/15: HbA1c 5.3%.  Labs 09/07/14: HbA1c 5.4%.   Labs 05/19/14: HbA1c 5.4%; TSH 1.336, free T4 0.91, free T3 3.2, anti-thyroglobulin antibody <1 (normal < 2), TPO antibody 2 (normal < 9); C-peptide 1.05    Assessment and Plan:  Assessment  ASSESSMENT:  1-4. Elevated HbA1c/History of pre-diabetes/impaired glucose tolerance/overweight:   A. Dana Thompson's HbA1c values in August and September 2015 were definitely in the prediabetes range. At those times her BMI was at or slightly above the 85%, c/w being overweight.   B. When she was relatively overweight, the amount of cytokines  produced by her "overly fat" adipose cells was excessive, she had more insulin resistance, and compensatory hyperinsulinemia. Despite the hyperinsulinemia, however, her HbA1c increased into the prediabetes range.   C. After making some changes in what she ate and how much she ate, and playing volleyball, her weight had decreased to the 76.3% and her HbA1c had decreased to the 5.3% range in October 2016.  Thereafter her weight and HbA1c fluctuated.    D. At her visit in May 2019 she had lost more weight, but her HbA1c had increased again. At her August 2019 visit she had re-gained some weight, but her HbA1c was lower. At her visit in August 2020, her weight has increased, but her HbA1c was lower.   E. At her visit in November 2021, her weight had increased into the obesity zone. Her HbA1c was 5.6%, at the upper limit of normal glucose tolerance.   F. At her visit in March 2022, she had lost 10 pounds. Her HbA1c in February was 5.5%. Today in June 2022 she has gained 16 pounds in weight, but her HbA1c is much lower.    5-6. Goiter/thyroiditis:   A. Dana Thompson has had waxing and waning of her thyroid gland size over time, c/w evolving Hashimoto's thyroiditis. She has also had episodic thyroid gland tenderness, also c/w evolving Hashimoto's thyroiditis. She has not had any more flare ups since her last visit.   B. Although there is no known family history of thyroid disease, the incidence and prevalence of Hashimoto's Dz is increasing not only in families, but in sporadic cases. If mom's fibromyalgia is indeed an autoimmune disease, as many experts believe, then the presence of autoimmunity is in the family genetics. At See's age, Hashimoto's thyroiditis is the most frequent cause of goiter.  C. Dana Thompson's antibody tests for Hashimoto's disease have remained negative. The negative antibodies do not, however, rule out Hashimoto's Dz, since Hashimoto's Dz is a T-lymphocyte-mediated phenomenon and antibodies are produced by B cells. These two groups of lymphocytes often do not act in synchrony.  D. Dana Thompson's TFTs have remained within normal limits, but she has had fluctuations of the TFTs that were c/w episodic thyroiditis.    E. Aideliz's thyroiditis was clinically quiescent at her visit in August 2020, but was clinically active today in November 2020. Her thyroiditis was active in March 2022, but quiescent in June 2022.  F.  Her TSH in February 2022 was mid-euthyroid. She is clinically euthyroid today.  7. Acanthosis nigricans: This condition has resolved. 8. Palpitations: Resolved   9. Fatigue: Her fatigue is better 10. Major depressive disorder: She looks fairly good today, but subjectively is not doing as well as she was doing in 2021. She has had some "fleeting" suicidal thoughts in the recent weeks, but is not actively planning to commit suicide.  11. Tremor: Resolved. 12. Feet pains: Resolved.   PLAN:  1. Diagnostic: CBG today. Repeat TFTs in early 2023. .   2. Therapeutic: Eat Right Diet, [facility] Diet as a source of menus to operationalize the Eat Right Diet. Plan her meals and the exercise.  3. Patient education: We discussed the issues of overweight, glucose intolerance, goiter, and thyroiditis. We again discussed the likelihood that she will become permanently hypothyroid. We also discussed the adverse effects of obesity. She told me that she wants to continue to see me because she feels comfortable with me.   4. Follow-up: 4 months    Level of Service: This visit lasted in excess of  55 minutes. More than 50% of the visit was devoted to counseling the family   Molli Knock, MD, CDE Pediatric and Adult Endocrinology

## 2021-03-17 ENCOUNTER — Encounter (HOSPITAL_COMMUNITY): Payer: Self-pay

## 2021-03-17 ENCOUNTER — Other Ambulatory Visit: Payer: Self-pay

## 2021-03-17 ENCOUNTER — Ambulatory Visit (HOSPITAL_COMMUNITY)
Admission: EM | Admit: 2021-03-17 | Discharge: 2021-03-17 | Disposition: A | Discharge status: Home / Self Care | Payer: 59 | Attending: Urgent Care | Admitting: Urgent Care

## 2021-03-17 DIAGNOSIS — B349 Viral infection, unspecified: Secondary | ICD-10-CM | POA: Diagnosis not present

## 2021-03-17 LAB — POC INFLUENZA A AND B ANTIGEN (URGENT CARE ONLY)
INFLUENZA A ANTIGEN, POC: NEGATIVE
INFLUENZA B ANTIGEN, POC: NEGATIVE

## 2021-03-17 NOTE — ED Triage Notes (Signed)
Pt presents with cough, sore throat, and generalized body aches X 3 days.

## 2021-03-17 NOTE — ED Provider Notes (Signed)
[place]    CSN: [id number] Arrival date & time: 03/17/21  [id number]      History   Chief Complaint Chief Complaint  Patient presents with   URI   Generalized Body Aches    HPI Dana Thompson is a 20 y.o. female.   Pleasant 20 year old female presents today with acute onset of sore throat, cough, body aches, and subjective fever.  She states late Tuesday evening she started with a dry scratchy throat, which progressed to a dry cough on Wednesday.  By yesterday however she states that she had severe body aches, chills, and feeling weak.  She works at [employer] at [employer], but denies any known sick contacts.  She states she is usually otherwise healthy and does not get sick.  She has not taken any over-the-counter medications other than ibuprofen x1 dose which did not help.  She did not get the flu vaccination this year.  She denies any severe headache, ear pain, sinus pressure, rhinorrhea, sputum production, shortness of breath, palpitations, chest pain.  She denies any rash.  She did have nausea with a few bouts of vomiting the first day, but states that has resolved.  She denies any abdominal pain today and states she is staying hydrated.   URI Presenting symptoms: fever and sore throat   Presenting symptoms: no congestion, no ear pain and no rhinorrhea   Associated symptoms: myalgias   Associated symptoms: no arthralgias, no sinus pain and no sneezing    Past Medical History:  Diagnosis Date   ADHD (attention deficit hyperactivity disorder)    Anxiety    Attention deficit disorder    Depression    Irregular heartbeat    Premature Atrial Contractions/ Palpitations   Pre-diabetes    Urinary tract infection    in past, but not currently    Patient Active Problem List   Diagnosis Date Noted   Severe recurrent major depression ([place]) 04/28/2020   MDD (major depressive disorder), recurrent episode, severe ([place]) 04/29/2017   Suicide ideation    MDD (major  depressive disorder), recurrent severe, without psychosis ([place]) 02/02/2017   Postprandial abdominal bloating 07/27/2016   Thyroiditis, autoimmune 01/10/2015   Acquired acanthosis nigricans 01/10/2015   Palpitations 01/10/2015   Prediabetes 06/07/2014   Childhood overweight, BMI 85-94.9 percentile 06/07/2014   Goiter 06/07/2014    History reviewed. No pertinent surgical history.  OB History   No obstetric history on file.      Home Medications    Prior to Admission medications   Medication Sig Start Date End Date Taking? Authorizing Provider  ARIPiprazole (ABILIFY) 5 MG tablet Take 5 mg by mouth daily.    [provider]  etonogestrel (NEXPLANON) 68 MG IMPL implant 1 each by Subdermal route once.    [provider]  hydrOXYzine (VISTARIL) 50 MG capsule Take 50 mg by mouth 3 (three) times daily as needed.    [provider]  venlafaxine XR (EFFEXOR-XR) 150 MG 24 hr capsule Take 150 mg by mouth daily with breakfast. 09/01/18   [provider]  venlafaxine XR (EFFEXOR-XR) 75 MG 24 hr capsule Take 75 mg by mouth daily. 03/24/20   [provider]  VYVANSE 50 MG capsule Take 50 mg by mouth daily. 01/18/20   [provider]    Family History Family History  Problem Relation Age of Onset   Cancer Maternal Grandmother    Cancer Paternal Grandmother    Hyperlipidemia Father    Hypertension Father  Social History Social History   Tobacco Use   Smoking status: Never   Smokeless tobacco: Never  Vaping Use   Vaping Use: Every day   Devices: nicotine  Substance Use Topics   Alcohol use: Yes    Alcohol/week: 1.0 standard drink    Types: 1 Standard drinks or equivalent per week    Comment: Once a month.   Drug use: Yes    Frequency: 7.0 times per week    Types: Marijuana    Comment: occasionally     Allergies   Patient has no known allergies.   Review of Systems Review of Systems  Constitutional:  Positive for  chills and fever.  HENT:  Positive for sore throat. Negative for congestion, dental problem, drooling, ear discharge, ear pain, facial swelling, hearing loss, mouth sores, nosebleeds, postnasal drip, rhinorrhea, sinus pressure, sinus pain and sneezing.   Eyes: Negative.   Respiratory: Negative.    Cardiovascular: Negative.   Gastrointestinal:  Positive for nausea (improving).  Endocrine: Negative.   Genitourinary: Negative.   Musculoskeletal:  Positive for myalgias. Negative for arthralgias and back pain.  Skin: Negative.   Neurological: Negative.   Hematological: Negative.   Psychiatric/Behavioral: Negative.    All other systems reviewed and are negative.   Physical Exam Triage Vital Signs ED Triage Vitals  Enc Vitals Group     BP 03/17/21 1031 101/74     Pulse Rate 03/17/21 1031 89     Resp 03/17/21 1031 18     Temp 03/17/21 1031 99 F (37.2 C)     Temp Source 03/17/21 1031 Oral     SpO2 03/17/21 1031 96 %     Weight --      Height --      Head Circumference --      Peak Flow --      Pain Score 03/17/21 1030 6     Pain Loc --      Pain Edu? --      Excl. in GC? --    No data found.  Updated Vital Signs BP 101/74 (BP Location: Left Arm)    Pulse 89    Temp 99 F (37.2 C) (Oral)    Resp 18    LMP 03/09/2021    SpO2 96%   Visual Acuity Right Eye Distance:   Left Eye Distance:   Bilateral Distance:    Right Eye Near:   Left Eye Near:    Bilateral Near:     Physical Exam Vitals and nursing note reviewed.  Constitutional:      General: She is not in acute distress.    Appearance: She is well-developed. She is obese. She is ill-appearing. She is not toxic-appearing or diaphoretic.  HENT:     Head: Normocephalic and atraumatic.     Right Ear: Tympanic membrane, ear canal and external ear normal. There is no impacted cerumen.     Left Ear: Tympanic membrane, ear canal and external ear normal. There is no impacted cerumen.     Nose: Nose normal. No congestion or  rhinorrhea.     Mouth/Throat:     Mouth: Mucous membranes are moist.     Pharynx: No oropharyngeal exudate or posterior oropharyngeal erythema.  Eyes:     General: No scleral icterus.       Right eye: No discharge.        Left eye: No discharge.     Extraocular Movements: Extraocular movements intact.     Conjunctiva/sclera: Conjunctivae  normal.     Pupils: Pupils are equal, round, and reactive to light.  Cardiovascular:     Rate and Rhythm: Normal rate and regular rhythm.     Pulses: Normal pulses.     Heart sounds: Normal heart sounds. No murmur heard.   No gallop.  Pulmonary:     Effort: Pulmonary effort is normal. No respiratory distress.     Breath sounds: Normal breath sounds. No stridor. No wheezing, rhonchi or rales.  Chest:     Chest wall: No tenderness.  Abdominal:     Palpations: Abdomen is soft.     Tenderness: There is no abdominal tenderness.  Musculoskeletal:        General: No swelling or tenderness. Normal range of motion.     Cervical back: Normal range of motion and neck supple. No tenderness.     Right lower leg: No edema.     Left lower leg: No edema.  Lymphadenopathy:     Cervical: No cervical adenopathy.  Skin:    General: Skin is warm and dry.     Capillary Refill: Capillary refill takes less than 2 seconds.  Neurological:     Mental Status: She is alert.  Psychiatric:        Mood and Affect: Mood normal.     UC Treatments / Results  Labs (all labs ordered are listed, but only abnormal results are displayed) Labs Reviewed  POC INFLUENZA A AND B ANTIGEN (URGENT CARE ONLY)    EKG   Radiology No results found.  Procedures Procedures (including critical care time)  Medications Ordered in UC Medications - No data to display  Initial Impression / Assessment and Plan / UC Course  I have reviewed the triage vital signs and the nursing notes.  Pertinent labs & imaging results that were available during my care of the patient were reviewed  by me and considered in my medical decision making (see chart for details).     Viral syndrome -supportive care, rest, hydration, Tylenol ibuprofen as needed.  Return to clinic for any new or worsening symptoms.  Final Clinical Impressions(s) / UC Diagnoses   Final diagnoses:  Viral syndrome     Discharge Instructions      Your symptoms are consistent with a viral infection. Your flu test was negative. Most viral illnesses run their course in 5-7 days. Alternate tylenol and ibuprofen for the fever/ body aches May try OTC Oscillococcinum to help with body aches. Hydrate with water and rest. Warm epsom salt baths may be beneficial.     ED Prescriptions   None    PDMP not reviewed this encounter.   Chaney Malling, [place] 03/17/21 1914

## 2021-03-17 NOTE — Discharge Instructions (Addendum)
Your symptoms are consistent with a viral infection. Your flu test was negative. Most viral illnesses run their course in 5-7 days. Alternate tylenol and ibuprofen for the fever/ body aches May try OTC Oscillococcinum to help with body aches. Hydrate with water and rest. Warm epsom salt baths may be beneficial.

## 2021-10-09 ENCOUNTER — Telehealth: Payer: Self-pay | Admitting: Nurse Practitioner

## 2021-10-09 ENCOUNTER — Ambulatory Visit: Payer: 59 | Admitting: Nurse Practitioner

## 2021-10-09 NOTE — Telephone Encounter (Signed)
Pt was a no show for her NP appointment with Lauren on 10/09/21, I sent a no show letter.

## 2021-10-26 ENCOUNTER — Encounter (INDEPENDENT_AMBULATORY_CARE_PROVIDER_SITE_OTHER): Payer: Self-pay

## 2021-10-27 NOTE — Telephone Encounter (Signed)
1st no show, fee waived, letter sent 

## 2022-03-08 ENCOUNTER — Encounter (HOSPITAL_COMMUNITY): Payer: Self-pay | Admitting: Oncology

## 2022-03-08 ENCOUNTER — Other Ambulatory Visit: Payer: Self-pay

## 2022-03-08 ENCOUNTER — Emergency Department (HOSPITAL_COMMUNITY)
Admission: EM | Admit: 2022-03-08 | Discharge: 2022-03-11 | Disposition: A | Discharge status: Home / Self Care | Payer: 59 | Attending: Emergency Medicine | Admitting: Emergency Medicine

## 2022-03-08 DIAGNOSIS — Z20822 Contact with and (suspected) exposure to covid-19: Secondary | ICD-10-CM | POA: Insufficient documentation

## 2022-03-08 DIAGNOSIS — D72829 Elevated white blood cell count, unspecified: Secondary | ICD-10-CM | POA: Diagnosis not present

## 2022-03-08 DIAGNOSIS — Z046 Encounter for general psychiatric examination, requested by authority: Secondary | ICD-10-CM | POA: Diagnosis present

## 2022-03-08 DIAGNOSIS — F332 Major depressive disorder, recurrent severe without psychotic features: Secondary | ICD-10-CM | POA: Diagnosis present

## 2022-03-08 DIAGNOSIS — R45851 Suicidal ideations: Secondary | ICD-10-CM | POA: Diagnosis not present

## 2022-03-08 DIAGNOSIS — N9489 Other specified conditions associated with female genital organs and menstrual cycle: Secondary | ICD-10-CM | POA: Insufficient documentation

## 2022-03-08 LAB — COMPREHENSIVE METABOLIC PANEL
ALT: 17 U/L (ref 0–44)
AST: 20 U/L (ref 15–41)
Albumin: 4.1 g/dL (ref 3.5–5.0)
Alkaline Phosphatase: 70 U/L (ref 38–126)
Anion gap: 11 (ref 5–15)
BUN: 10 mg/dL (ref 6–20)
CO2: 19 mmol/L — ABNORMAL LOW (ref 22–32)
Calcium: 9.6 mg/dL (ref 8.9–10.3)
Chloride: 106 mmol/L (ref 98–111)
Creatinine, Ser: 0.86 mg/dL (ref 0.44–1.00)
GFR, Estimated: >60 mL/min (ref >60)
Glucose, Bld: 86 mg/dL (ref 70–99)
Potassium: 3.6 mmol/L (ref 3.5–5.1)
Sodium: 136 mmol/L (ref 135–145)
Total Bilirubin: 0.3 mg/dL (ref 0.3–1.2)
Total Protein: 7.6 g/dL (ref 6.5–8.1)

## 2022-03-08 LAB — CBC
HCT: 49.1 % — ABNORMAL HIGH (ref 36.0–46.0)
Hemoglobin: 15.5 g/dL — ABNORMAL HIGH (ref 12.0–15.0)
MCH: 28.8 pg (ref 26.0–34.0)
MCHC: 31.6 g/dL (ref 30.0–36.0)
MCV: 91.1 fL (ref 80.0–100.0)
Platelets: 350 10*3/uL (ref 150–400)
RBC: 5.39 MIL/uL — ABNORMAL HIGH (ref 3.87–5.11)
RDW: 13.1 % (ref 11.5–15.5)
WBC: 14.3 10*3/uL — ABNORMAL HIGH (ref 4.0–10.5)
nRBC: 0 % (ref 0.0–0.2)

## 2022-03-08 LAB — RESP PANEL BY RT-PCR (RSV, FLU A&B, COVID)  RVPGX2
Influenza A by PCR: NEGATIVE
Influenza B by PCR: NEGATIVE
Resp Syncytial Virus by PCR: NEGATIVE
SARS Coronavirus 2 by RT PCR: NEGATIVE

## 2022-03-08 LAB — I-STAT BETA HCG BLOOD, ED (MC, WL, AP ONLY): I-stat hCG, quantitative: <5 m[IU]/mL (ref <5)

## 2022-03-08 MED ORDER — NICOTINE 14 MG/24HR TD PT24
14.0000 mg | MEDICATED_PATCH | Freq: Once | TRANSDERMAL | Status: AC
  Administered 2022-03-08: 14 mg via TRANSDERMAL
  Filled 2022-03-08: qty 1

## 2022-03-08 MED ORDER — HYDROXYZINE HCL 25 MG PO TABS
50.0000 mg | ORAL_TABLET | Freq: Once | ORAL | Status: AC
  Administered 2022-03-08: 50 mg via ORAL
  Filled 2022-03-08: qty 2

## 2022-03-08 NOTE — ED Notes (Signed)
Belongings will be in locker 32.
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
## 2022-03-08 NOTE — ED Notes (Signed)
Medicated for anxiety atarax, pt made aware that TTS needs done and I need to re-draw dark green blood tube
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
## 2022-03-09 ENCOUNTER — Inpatient Hospital Stay: Admission: RE | Admit: 2022-03-09 | Payer: 59 | Source: Intra-hospital | Admitting: Psychiatry

## 2022-03-09 DIAGNOSIS — R45851 Suicidal ideations: Secondary | ICD-10-CM | POA: Diagnosis not present

## 2022-03-09 LAB — URINALYSIS, ROUTINE W REFLEX MICROSCOPIC
Bilirubin Urine: NEGATIVE
Glucose, UA: NEGATIVE mg/dL
Ketones, ur: NEGATIVE mg/dL
Leukocytes,Ua: NEGATIVE
Nitrite: NEGATIVE
Protein, ur: NEGATIVE mg/dL
Specific Gravity, Urine: 1.015 (ref 1.005–1.030)
pH: 5 (ref 5.0–8.0)

## 2022-03-09 LAB — RAPID URINE DRUG SCREEN, HOSP PERFORMED
Amphetamines: POSITIVE — AB
Barbiturates: NOT DETECTED
Benzodiazepines: NOT DETECTED
Cocaine: NOT DETECTED
Opiates: NOT DETECTED
Tetrahydrocannabinol: POSITIVE — AB

## 2022-03-09 LAB — ACETAMINOPHEN LEVEL: Acetaminophen (Tylenol), Serum: <10 ug/mL — ABNORMAL LOW (ref 10–30)

## 2022-03-09 LAB — ETHANOL: Alcohol, Ethyl (B): <10 mg/dL (ref <10)

## 2022-03-09 LAB — SALICYLATE LEVEL: Salicylate Lvl: <7 mg/dL — ABNORMAL LOW (ref 7.0–30.0)

## 2022-03-09 MED ORDER — BREXPIPRAZOLE 1 MG PO TABS
2.0000 mg | ORAL_TABLET | Freq: Every morning | ORAL | Status: DC
  Administered 2022-03-09 – 2022-03-11 (×3): 2 mg via ORAL
  Filled 2022-03-09 (×2): qty 2
  Filled 2022-03-09: qty 1
  Filled 2022-03-09: qty 2

## 2022-03-09 MED ORDER — ACETAMINOPHEN 325 MG PO TABS
650.0000 mg | ORAL_TABLET | Freq: Four times a day (QID) | ORAL | Status: DC | PRN
  Administered 2022-03-09 – 2022-03-10 (×3): 650 mg via ORAL
  Filled 2022-03-09 (×3): qty 2

## 2022-03-09 MED ORDER — VENLAFAXINE HCL ER 75 MG PO CP24
150.0000 mg | ORAL_CAPSULE | Freq: Every day | ORAL | Status: DC
  Administered 2022-03-10 – 2022-03-11 (×2): 150 mg via ORAL
  Filled 2022-03-09 (×2): qty 2

## 2022-03-09 MED ORDER — HYDROXYZINE HCL 25 MG PO TABS
50.0000 mg | ORAL_TABLET | Freq: Three times a day (TID) | ORAL | Status: DC | PRN
  Administered 2022-03-09 – 2022-03-11 (×4): 50 mg via ORAL
  Filled 2022-03-09 (×7): qty 2

## 2022-03-09 NOTE — Consult Note (Addendum)
[facility] ED ASSESSMENT   Reason for Consult:  Suicidal Ideations Referring Physician:  Dr. Wallace Cullens Patient Identification: Dana Thompson MRN:  [id number] ED Chief Complaint: Suicide ideation  Diagnosis:  Principal Problem:   Suicide ideation Active Problems:   MDD (major depressive disorder), recurrent severe, without psychosis (HCC)   ED Assessment Time Calculation: Start Time: 0930 Stop Time: 1005 Total Time in Minutes (Assessment Completion): 35   Subjective:   Dana Thompson is a 21 y.o. female patient brought in by [facility] accompanied by [facility] d/t pt calling 911 and verbalizing SI. Pt was screaming at dispatch incoherently. Initial officers on scene found pt behind wheel of car stating, "I will swerve and end it all." Pt was physically removed from the vehicle by deputies. Pt was combative w/ [facility] attempting to head butt and kick deputies. De escalation done in the field. Pt presents in forensic restraints. Per [facility] pt has long psychiatric history and has been IVC here in the past.    HPI: Dana Thompson, 21 y.o., female patient seen face to face by this provider, chart reviewed on 03/09/22.  On evaluation Dana Thompson reports she missed taking her medications on Wednesday 03/07/22 and it made her "feel unstable" when provider asked her if missing one dose makes her act like this she said "sometimes", even though I explained to her it says in your chart, you missed more than one dose and she denies missing more than one dose. Patient denied having any other triggers that could have caused this mental breakdown. 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 Patient says she has been hospitalized about 4 times and she hasn't be hospitalized in about a year and a half and it was at [facility], she does not feel inpatient hospitalization is for her. Patient appears very vague and guarded about the events  that happened, minimal insight into situation. Patient also states she sees Dr. Jeanie Sewer at [facility] and she enjoys seeing him, she states she is compliant with her visits and medications most of the time. Patient is very interested in ECT therapy with Dr. Toni Amend.   Mother Dana Thompson came in to visit patient, per patient it was ok to speak with mother, provider informed mother that patient is IVC and due to severity of event last night, she will more than likely be admitted to an inpatient facility. Mother and daughter are not in favor of her being admitted to an inpatient facility. Mother is also in favor of patient having ECT treatments done. Patient mother stated in front of patient who was not aware that she and patient biological father are looking into patient going to a hypnotherapy Center in [place].   During evaluation Dana Thompson is sitting on her bed in no acute distress. She is alert, oriented x 4, calm, cooperative and attentive. Her mood is euthymic with congruent affect. She has normal speech, and behavior.  Objectively there is no evidence of psychosis/mania or delusional thinking. Patient is Thompson to converse coherently, goal directed thoughts, no distractibility, or pre-occupation.  She also currently denies suicidal/self-harm/homicidal ideation, psychosis, and paranoia.  Patient answered question appropriately. Patient states her appetite is fair due to her taking Vyvanse, she also states her sleep is good.      Past Psychiatric History: Bipolar, Depression and Multiple inpatient admissions  Risk to Self or  Others: Is the patient at risk to self? Yes Has the patient been a risk to self in the past 6 months? No Has the patient been a risk to self within the distant past? Yes Is the patient a risk to others? No Has the patient been a risk to others in the past 6 months? No Has the patient been a risk to others within the distant past? No  [place] Scale:   Flowsheet Row ED from 03/08/2022 in [facility] from 03/17/2021 in [facility] E[facility] Treatment from 09/16/2020 in [facility]  C-SSRS RISK CATEGORY Error: Q3, 4, or 5 should not be populated when Q2 is No No Risk Error: Q3, 4, or 5 should not be populated when Q2 is No       AIMS:  , , ,  ,   ASAM:    Substance Abuse:     Past Medical History:  Past Medical History:  Diagnosis Date   ADHD (attention deficit hyperactivity disorder)    Anxiety    Attention deficit disorder    Depression    Irregular heartbeat    Premature Atrial Contractions/ Palpitations   Pre-diabetes    Urinary tract infection    in past, but not currently   History reviewed. No pertinent surgical history. Family History:  Family History  Problem Relation Age of Onset   Cancer Maternal Grandmother    Cancer Paternal Grandmother    Hyperlipidemia Father    Hypertension Father     Social History:  Social History   Substance and Sexual Activity  Alcohol Use Yes   Alcohol/week: 1.0 standard drink of alcohol   Types: 1 Standard drinks or equivalent per week   Comment: Once a month.     Social History   Substance and Sexual Activity  Drug Use Yes   Frequency: 7.0 times per week   Types: Marijuana   Comment: occasionally    Social History   Socioeconomic History   Marital status: Single    Spouse name: Not on file   Number of children: Not on file   Years of education: Not on file   Highest education level: Not on file  Occupational History   Not on file  Tobacco Use   Smoking status: Never   Smokeless tobacco: Never  Vaping Use   Vaping Use: Every day   Devices: nicotine  Substance and Sexual Activity   Alcohol use: Yes    Alcohol/week: 1.0 standard drink of alcohol    Types: 1 Standard drinks or equivalent per week    Comment: Once a month.   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     Lives with father now- 06/29/20   Social Determinants of Health   Financial Resource Strain: Not on file  Food Insecurity: Not on file  Transportation Needs: Not on file  Physical Activity: Not on file  Stress: Not on file  Social Connections: Not on file   Additional Social History:    Allergies:  No Known Allergies  Labs:  Results for orders placed or  performed during the hospital encounter of 03/08/22 (from the past 48 hour(s))  Comprehensive metabolic panel     Status: Abnormal   Collection Time: 03/08/22  5:14 PM  Result Value Ref Range   Sodium 136 135 - 145 mmol/L   Potassium 3.6 3.5 - 5.1 mmol/L   Chloride 106 98 - 111 mmol/L   CO2 19 (L) 22 - 32 mmol/L   Glucose, Bld 86 70 - 99 mg/dL    Comment: Glucose reference range applies only to samples taken after fasting for at least 8 hours.   BUN 10 6 - 20 mg/dL   Creatinine, Ser 1.61 0.44 - 1.00 mg/dL   Calcium 9.6 8.9 - 09.6 mg/dL   Total Protein 7.6 6.5 - 8.1 g/dL   Albumin 4.1 3.5 - 5.0 g/dL   AST 20 15 - 41 U/L   ALT 17 0 - 44 U/L   Alkaline Phosphatase 70 38 - 126 U/L   Total Bilirubin 0.3 0.3 - 1.2 mg/dL   GFR, Estimated >04 >54 mL/min    Comment: (NOTE) Calculated using the CKD-EPI Creatinine Equation (2021)    Anion gap 11 5 - 15    Comment: Performed at [facility], [address], [place], [place] [address]  cbc     Status: Abnormal   Collection Time: 03/08/22  5:14 PM  Result Value Ref Range   WBC 14.3 (H) 4.0 - 10.5 K/uL   RBC 5.39 (H) 3.87 - 5.11 MIL/uL   Hemoglobin 15.5 (H) 12.0 - 15.0 g/dL   HCT 91.4 (H) 78.2 - 95.6 %   MCV 91.1 80.0 - 100.0 fL   MCH 28.8 26.0 - 34.0 pg   MCHC 31.6 30.0 - 36.0 g/dL   RDW 21.3 08.6  - 57.8 %   Platelets 350 150 - 400 K/uL   nRBC 0.0 0.0 - 0.2 %    Comment: Performed at [facility], [address], [place], [place] [address]  I-Stat beta hCG blood, ED     Status: None   Collection Time: 03/08/22  5:23 PM  Result Value Ref Range   I-stat hCG, quantitative <5.0 <5 mIU/mL   Comment 3            Comment:   GEST. AGE      CONC.  (mIU/mL)   <=1 WEEK        5 - 50     2 WEEKS       50 - 500     3 WEEKS       100 - 10,000     4 WEEKS     1,000 - 30,000        FEMALE AND NON-PREGNANT FEMALE:     LESS THAN 5 mIU/mL   Resp panel by RT-PCR (RSV, Flu A&B, Covid) Anterior Nasal Swab     Status: None   Collection Time: 03/08/22  6:48 PM   Specimen: Anterior Nasal Swab  Result Value Ref Range   SARS Coronavirus 2 by RT PCR NEGATIVE NEGATIVE    Comment: (NOTE) SARS-CoV-2 target nucleic acids are NOT DETECTED.  The SARS-CoV-2 RNA is generally detectable in upper respiratory specimens during the acute phase of infection. The lowest concentration of SARS-CoV-2 viral copies this assay can detect is 138 copies/mL. A negative result does not preclude SARS-Cov-2 infection and should not be used as the sole basis for treatment or other patient management decisions. A negative result may occur with  improper  specimen collection/handling, submission of specimen other than nasopharyngeal swab, presence of viral mutation(s) within the areas targeted by this assay, and inadequate number of viral copies(<138 copies/mL). A negative result must be combined with clinical observations, patient history, and epidemiological information. The expected result is Negative.  Fact Sheet for Patients:  BloggerCourse.com  Fact Sheet for Healthcare Providers:  SeriousBroker.it  This test is no t yet approved or cleared by the [place] FDA and  has been authorized for detection and/or diagnosis of SARS-CoV-2 by FDA under an  Emergency Use Authorization (EUA). This EUA will remain  in effect (meaning this test can be used) for the duration of the COVID-19 declaration under Section 564(b)(1) of the Act, 21 U.S.C.section 360bbb-3(b)(1), unless the authorization is terminated  or revoked sooner.       Influenza A by PCR NEGATIVE NEGATIVE   Influenza B by PCR NEGATIVE NEGATIVE    Comment: (NOTE) The Xpert Xpress SARS-CoV-2/FLU/RSV plus assay is intended as an aid in the diagnosis of influenza from Nasopharyngeal swab specimens and should not be used as a sole basis for treatment. Nasal washings and aspirates are unacceptable for Xpert Xpress SARS-CoV-2/FLU/RSV testing.  Fact Sheet for Patients: BloggerCourse.com  Fact Sheet for Healthcare Providers: SeriousBroker.it  This test is not yet approved or cleared by the [place] FDA and has been authorized for detection and/or diagnosis of SARS-CoV-2 by FDA under an Emergency Use Authorization (EUA). This EUA will remain in effect (meaning this test can be used) for the duration of the COVID-19 declaration under Section 564(b)(1) of the Act, 21 U.S.C. section 360bbb-3(b)(1), unless the authorization is terminated or revoked.     Resp Syncytial Virus by PCR NEGATIVE NEGATIVE    Comment: (NOTE) Fact Sheet for Patients: BloggerCourse.com  Fact Sheet for Healthcare Providers: SeriousBroker.it  This test is not yet approved or cleared by the [place] FDA and has been authorized for detection and/or diagnosis of SARS-CoV-2 by FDA under an Emergency Use Authorization (EUA). This EUA will remain in effect (meaning this test can be used) for the duration of the COVID-19 declaration under Section 564(b)(1) of the Act, 21 U.S.C. section 360bbb-3(b)(1), unless the authorization is terminated or revoked.  Performed at [facility], [address] [place], [place] [address]   Acetaminophen level     Status: Abnormal   Collection Time: 03/08/22 11:16 PM  Result Value Ref Range   Acetaminophen (Tylenol), Serum <10 (L) 10 - 30 ug/mL    Comment: (NOTE) Therapeutic concentrations vary significantly. A range of 10-30 ug/mL  may be an effective concentration for many patients. However, some  are best treated at concentrations outside of this range. Acetaminophen concentrations >150 ug/mL at 4 hours after ingestion  and >50 ug/mL at 12 hours after ingestion are often associated with  toxic reactions.  Performed at [facility], [address], [place], [place] [address]   Ethanol     Status: None   Collection Time: 03/08/22 11:16 PM  Result Value Ref Range   Alcohol, Ethyl (B) <10 <10 mg/dL    Comment: (NOTE) Lowest detectable limit for serum alcohol is 10 mg/dL.  For medical purposes only. Performed at [facility], [address], [place], [place] [address]   Salicylate level     Status: Abnormal   Collection Time: 03/08/22 11:16 PM  Result Value Ref Range   Salicylate Lvl <7.0 (L) 7.0 - 30.0 mg/dL    Comment: Performed at [facility], [address], [place], [place] [address]    Current Facility-Administered Medications  Medication Dose Route Frequency Provider Last Rate Last Admin   nicotine (NICODERM CQ - dosed in mg/24 hours) patch 14 mg  14 mg Transdermal Once Tanda Rockers A, DO   14 mg at 03/08/22 2304   Current Outpatient Medications  Medication Sig Dispense Refill   ARIPiprazole (ABILIFY) 5 MG tablet Take 5 mg by mouth daily.     etonogestrel (NEXPLANON) 68 MG IMPL implant 1 each by Subdermal route once.     hydrOXYzine (VISTARIL) 50 MG capsule Take 50 mg by mouth 3 (three) times daily as needed.     venlafaxine XR (EFFEXOR-XR) 150 MG 24 hr capsule Take 150 mg by mouth daily with breakfast.     venlafaxine XR (EFFEXOR-XR) 75 MG 24 hr capsule  Take 75 mg by mouth daily.     VYVANSE 50 MG capsule Take 50 mg by mouth daily.      Musculoskeletal: Strength & Muscle Tone: within normal limits Gait & Station: normal Patient leans: N/A   Psychiatric Specialty Exam: Presentation  General Appearance:  Appropriate for Environment  Eye Contact: Good  Speech: Clear and Coherent  Speech Volume: Normal  Handedness: Right   Mood and Affect  Mood: Euthymic  Affect: Appropriate; Flat   Thought Process  Thought Processes: Coherent  Descriptions of Associations:Intact  Orientation:Full (Time, Place and Person)  Thought Content:Scattered  History of Schizophrenia/Schizoaffective disorder:No data recorded Duration of Psychotic Symptoms:No data recorded Hallucinations:Hallucinations: None  Ideas of Reference:None  Suicidal Thoughts:Suicidal Thoughts: No  Homicidal Thoughts:Homicidal Thoughts: No   Sensorium  Memory: Immediate Fair; Remote Fair  Judgment: Fair  Insight: Fair   Art therapist  Concentration: Good  Attention Span: Good  Recall: Fair  Fund of Knowledge: Fair  Language: Good   Psychomotor Activity  Psychomotor Activity: Psychomotor Activity: Normal   Assets  Assets: Communication Skills; Housing; Social Support    Sleep  Sleep: Sleep: Good   Physical Exam: Physical Exam HENT:     Head: Normocephalic.     Nose: Nose normal.  Cardiovascular:     Rate and Rhythm: Normal rate.  Pulmonary:     Effort: Pulmonary effort is normal.  Musculoskeletal:        General: Normal range of motion.  Neurological:     Mental Status: She is alert.  Psychiatric:        Attention and Perception: Attention normal.        Mood and Affect: Mood normal.        Speech: Speech normal.        Behavior: Behavior is cooperative.        Thought Content: Thought content normal.        Cognition and Memory: Cognition normal.        Judgment: Judgment is impulsive.     Review of Systems  Respiratory: Negative.    Gastrointestinal: Negative.   Musculoskeletal: Negative.   Skin: Negative.   Psychiatric/Behavioral: Negative.     Blood pressure 102/60, pulse 65, temperature 98.1 F (36.7 C), temperature source Oral, resp. rate 18, height 5\' 6"  (1.676 m), weight 94.3 kg, SpO2 99 %. Body mass index is 33.57 kg/m.  Medical Decision Making: Dana Thompson is a 21 y.o. female patient brought in by [facility] accompanied by [facility] d/t pt calling 911 and verbalizing SI. Pt was screaming at dispatch incoherently. Initial officers on scene found pt behind wheel of car stating, "  I will swerve and end it all." Pt was physically removed from the vehicle by deputies. Pt was combative w/ [facility] attempting to head butt and kick deputies. De escalation done in the field. Pt presents in forensic restraints. Per [facility] pt has long psychiatric history and has been IVC here in the past.  Patient requires inpatient Psychiatric hospitalization. Patient is willing to participate in ECT, she is currently under review at [facility].     Continue Rexulti 2 mg daily for depression Continue Effexor 150 mg daily for depression      Disposition: Recommend psychiatric Inpatient admission when medically cleared.  Alastair Hennes MOTLEY-MANGRUM, PMHNP 03/09/2022 10:15 AM

## 2022-03-09 NOTE — ED Notes (Signed)
Patient has been alert this shift. Patient anxious and labile at times. Patient tearful and agitated, patient redirected and reassured.

## 2022-03-09 NOTE — BH Assessment (Signed)
Clinician spoke to Dana Thompson, [place] and noted the pt is currently sleeping unable to engage in TTS assessment. Clinician asked RN to call or message her when pt is alert and able to engage.     Redmond Pulling, MS, [facility], [facility] Triage Specialist [phone]

## 2022-03-09 NOTE — ED Notes (Signed)
Dana Thompson is currently sleeping no signs of distress.

## 2022-03-09 NOTE — ED Notes (Signed)
Remains asleep no distress noted. ?

## 2022-03-09 NOTE — ED Notes (Signed)
Remains asleep no distress noted skin color appropriate for ethnicity. 

## 2022-03-09 NOTE — ED Provider Notes (Signed)
Emergency Medicine Observation Re-evaluation Note  Dana Thompson is a 21 y.o. female, seen on rounds today.  Pt initially presented to the ED for complaints of Suicidal Currently, the patient is asleep resting comfortably.  Physical Exam  BP 102/60 (BP Location: Right Arm)   Pulse 65   Temp 98.1 F (36.7 C) (Oral)   Resp 18   Ht 5\' 6"  (1.676 m)   Wt 94.3 kg   SpO2 99%   BMI 33.57 kg/m  Physical Exam General: Asleep, no acute distress Cardiac: Regular rate Lungs: No increased WOB Psych: Calm, asleep  ED Course / MDM  EKG:EKG Interpretation  Date/Time:  Thursday March 08 2022 22:32:35 EST Ventricular Rate:  63 PR Interval:  154 QRS Duration: 94 QT Interval:  400 QTC Calculation: 409 R Axis:   80 Text Interpretation: Normal sinus rhythm with sinus arrhythmia Normal ECG When compared with ECG of 29-Jun-2020 10:45, PREVIOUS ECG IS PRESENT similar to prev no stemi Confirmed by 01-Jul-2020 ([id number]) on 03/08/2022 10:42:47 PM  I have reviewed the labs performed to date as well as medications administered while in observation.  Recent changes in the last 24 hours include medically cleared, pending psych eval.  Plan  Current plan is for Psych eval for disposition.    03/10/2022 K, DO 03/09/22 [id number]

## 2022-03-09 NOTE — BH Assessment (Addendum)
Disposition Note:   Per Sabra Heck, NP, "patient continues to meet criteria for psychiatric admission. I would recommend patient be under involuntary commitment due to her impulsive behavior. Agree with plan to restart home medication. Psychiatry will continue to follow until higher level of care treatment facility is secured."  Patient accepted to [facility], tentatively, by Dr. Toni Amend. However, her transfer to [facility] is dependent upon their staffing. Per Demetri, RN their nurse manager is reaching out to find staffing.    Clinician awaiting follow up from [facility] nursing staff regarding a time of when patient may transfer to their facility.

## 2022-03-09 NOTE — ED Notes (Signed)
Dana Thompson is very angry and requesting discharge. She was informed that she is currently IVC and was being medically cleared before receiving her psychiatric evaluation. She agreed to allow a repeat EKG and blood work to finish her medical evaluation. Nicotine patch order obtained and applied to right shoulder.

## 2022-03-10 DIAGNOSIS — R45851 Suicidal ideations: Secondary | ICD-10-CM | POA: Diagnosis not present

## 2022-03-10 MED ORDER — NICOTINE POLACRILEX 2 MG MT GUM
2.0000 mg | CHEWING_GUM | OROMUCOSAL | Status: DC
  Administered 2022-03-10 – 2022-03-11 (×3): 2 mg via ORAL
  Filled 2022-03-10 (×4): qty 1

## 2022-03-10 MED ORDER — LORAZEPAM 1 MG PO TABS: 1.0000 mg | ORAL_TABLET | Freq: Once | ORAL | Status: AC

## 2022-03-10 MED ORDER — LORAZEPAM 2 MG/ML IJ SOLN
1.0000 mg | Freq: Once | INTRAMUSCULAR | Status: AC
  Administered 2022-03-10: 1 mg via INTRAMUSCULAR
  Filled 2022-03-10: qty 1

## 2022-03-10 MED ORDER — MELATONIN 3 MG PO TABS
3.0000 mg | ORAL_TABLET | Freq: Every day | ORAL | Status: DC
  Administered 2022-03-10 (×2): 3 mg via ORAL
  Filled 2022-03-10 (×2): qty 1

## 2022-03-10 NOTE — ED Notes (Signed)
Call to [facility] adult psych staffing remains an issue they will call us when bed is available.

## 2022-03-10 NOTE — Progress Notes (Signed)
Per Morrie Sheldon, pt has been accepted to [facility] [place] [facility]. Accepting provider is Dr. Guss Bunde. Patient can arrive anytime after 4:00pm today. Number for report is [phone]. Please fax IVC paperwork to [phone] before calling report.   Crissie Reese, MSW, LCSW-A Phone: [phone] Disposition/TOC

## 2022-03-10 NOTE — ED Notes (Signed)
Pt upset about not knowing when she will be leaving. Let pt know I would inform her when I knew something.

## 2022-03-10 NOTE — ED Notes (Signed)
Attempted report at this time, nurse refused since the pt would not be going until after 1600 today.

## 2022-03-10 NOTE — ED Provider Notes (Signed)
Spoke with patient father Dana Thompson, per patient permission, provider informed father of the disposition that was made for patient. Mr. Pharo did not agree he thinks patient should be allowed to be inpatient in a facility that offers ECT. Patient father stated that he spoke with his supervisor and he will be taking an extended leave to assist in looking after his daughter's well being. Mr. Harshman asked that she go to a facility in [place] that does ECT closer to her home. Discussed with Mr. Rovira that due to the severity of the incident on 03/08/22, and that patient was aggressive towards [employer] and verbalizing SI, that patient has to be transferred to an inpatient facility and since she was accepted at [facility] that is where we will transport her to.

## 2022-03-10 NOTE — ED Notes (Signed)
Pt is sleeping atarax held. Pt respirations appears easy skin color WNL for ethnicity, no signs of distress or sleep disturbance are noted.

## 2022-03-10 NOTE — ED Notes (Signed)
Mom at bedside and spoke with Psych NP.

## 2022-03-10 NOTE — ED Notes (Signed)
Very negative response when offered melatonin stated " If I'm going to be trapped I might as well sleep". Dana Thompson continues to no appreciate that this admission and transfer is for her safety and appears to lack the insight of how serious it is when she verbalizes suicidal intent. 1:1 sitter remains in place patient needs address.

## 2022-03-10 NOTE — ED Notes (Signed)
Dana Thompson is awake and concerned about pending transfer to [facility] adult psych unit.details of transfer process reviewed with pt. Pt medicated for anxiety and sleeplessness related to transfer attempted to process pt feelings but she was resistant and refused further discussion. Melatonin offered and refused, pt informed it would be available if she changed her mind.

## 2022-03-10 NOTE — ED Notes (Signed)
Remains asleep no sleep disturbance or distress noted respirations appear easy and skin color appropriate for ethnicity.

## 2022-03-10 NOTE — ED Notes (Signed)
[facility] called and advised to call back for transport tonight after 5pm and that pt would have to be transported tomorrow.

## 2022-03-10 NOTE — Progress Notes (Signed)
[facility] ED Progress Note  03/10/2022 12:52 PM Dana Thompson  MRN:  [id number]   Principal Problem: Suicide ideation Diagnosis:  Principal Problem:   Suicide ideation Active Problems:   MDD (major depressive disorder), recurrent severe, without psychosis (HCC)   ED Assessment Time Calculation: Start Time: 200935 Stop Time: 0955 Total Time in Minutes (Assessment Completion): 20   Subjective:  Dana Thompson is a 21 y.o. female patient [facility] accompanied by [facility] d/t pt calling 911 and verbalizing SI. Pt was screaming at dispatch incoherently. Initial officers on scene found pt behind wheel of car stating, "I will swerve and end it all." Pt was physically removed from the vehicle by deputies. Pt was combative w/ [employer] attempting to head butt and kick deputies. De escalation done in the field. Pt presents in forensic restraints. Per [facility] pt has long psychiatric history and has been IVC here in the past.   HPI: Dana Thompson, 21 y.o., female patient seen face to face by this provider, consulted with Dr. Lolly MustacheArfeen; and chart reviewed on 03/10/22.  On evaluation Dana Thompson reports that she does not want to go inpatient, patient is very tearful and yelling, she feels that being admitted to an inpatient facility does not help her. Patient continues to not show any insight into what happened the prior night, when she had to be handcuffed by [employer], due to verbalizing SI and attempting to head butt and kick the officers. Provider explained due to the circumstances and the severity, she has been IVCd and has an open [facility] at [facility]. Patient continued to cry and not listen to this provider.   During evaluation Dana Thompson is sitting on her bed, crying and apologizing for what she has done. She is alert, oriented x 4, cooperative. Her mood is euthymic with flat/sad affect.  She has normal speech with increased volume, and behavior.  Objectively there is no evidence of  psychosis/mania or delusional thinking.  Patient is able to converse coherently when redirected, goal directed thoughts, no distractibility, or pre-occupation. She also denies suicidal/self-harm/homicidal ideation, psychosis, and paranoia.   Patient mother  Dana Thompson walked into the hall and spoke with provider and RN with patient permission. Patient mother who was in tears, discussed with provider that if patient is sent to an inpatient facility other than [facility] she will decompensate and per mother it will take six months to get patient "back on track". Dana Thompson stated that she was in agreement with [facility] because patient will be able to get her ECT treatments.  Dana Thompson advocated for her daughter and stated that she (mother) is a retired Engineer, civil (consulting)nurse and that she will look after her daughter, she states she has been looking after daughter for a while and she knows that patient needs help, and she is looking at long term places for her daughter, she just doesn't feel inpatient is enough. Mother is willing to take responsibility for her daughter with her husband and ex-husband support and help. She states that patient biological father only lives 8 minutes away from them. Patient states she thinks daughter may have been stressed out do to the fact she had a "breakthrough in therapy" and it scared daughter and also patient started at [employer] and picked up 4 classes two more than she usually takes and the workload became stressful.   Consulted with Dr. Lolly MustacheArfeen, and he agreed on inpatient treatment for patient.  Past Psychiatric History: Bipolar, anxiety, depression,  SI  [place] Scale:  Flowsheet Row ED from 03/08/2022 in [facility] from 03/17/2021 in [facility] E[facility] Treatment from 09/16/2020 in [facility]  C-SSRS RISK CATEGORY Error: Q3, 4, or 5 should not be populated when Q2 is No No Risk Error: Q3, 4, or 5 should not  be populated when Q2 is No       Past Medical History:  Past Medical History:  Diagnosis Date   ADHD (attention deficit hyperactivity disorder)    Anxiety    Attention deficit disorder    Depression    Irregular heartbeat    Premature Atrial Contractions/ Palpitations   Pre-diabetes    Urinary tract infection    in past, but not currently   History reviewed. No pertinent surgical history. Family History:  Family History  Problem Relation Age of Onset   Cancer Maternal Grandmother    Cancer Paternal Grandmother    Hyperlipidemia Father    Hypertension Father     Social History:  Social History   Substance and Sexual Activity  Alcohol Use Yes   Alcohol/week: 1.0 standard drink of alcohol   Types: 1 Standard drinks or equivalent per week   Comment: Once a month.     Social History   Substance and Sexual Activity  Drug Use Yes   Frequency: 7.0 times per week   Types: Marijuana   Comment: occasionally    Social History   Socioeconomic History   Marital status: Single    Spouse name: Not on file   Number of children: Not on file   Years of education: Not on file   Highest education level: Not on file  Occupational History   Not on file  Tobacco Use   Smoking status: Never   Smokeless tobacco: Never  Vaping Use   Vaping Use: Every day   Devices: nicotine  Substance and Sexual Activity   Alcohol use: Yes    Alcohol/week: 1.0 standard drink of alcohol    Types: 1 Standard drinks or equivalent per week    Comment: Once a month.   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     Lives with father now- 06/29/20   Social Determinants of Health   Financial Resource Strain: Not on file  Food  Insecurity: Not on file  Transportation Needs: Not on file  Physical Activity: Not on file  Stress: Not on file  Social Connections: Not on file    Sleep: Good  Appetite:  Good  Current Medications: Current Facility-Administered Medications  Medication Dose Route Frequency Provider Last Rate Last Admin   acetaminophen (TYLENOL) tablet 650 mg  650 mg Oral Q6H PRN Elayne Snare K, DO   650 mg at 03/10/22 0857   brexpiprazole (REXULTI) tablet 2 mg  2 mg Oral q morning Motley-Mangrum, Keona Bilyeu A, PMHNP   2 mg at 03/10/22 0951   hydrOXYzine (ATARAX) tablet 50 mg  50 mg Oral TID PRN Motley-Mangrum, Inger Wiest A, PMHNP   50 mg at 03/10/22 0211   melatonin tablet 3 mg  3 mg Oral QHS Melene Plan,  DO   3 mg at 03/10/22 0250   venlafaxine XR (EFFEXOR-XR) 24 hr capsule 150 mg  150 mg Oral Q breakfast Motley-Mangrum, Latonia Conrow A, PMHNP   150 mg at 03/10/22 0847   Current Outpatient Medications  Medication Sig Dispense Refill   REXULTI 2 MG TABS tablet Take 2 mg by mouth every morning.     ARIPiprazole (ABILIFY) 5 MG tablet Take 5 mg by mouth daily.     etonogestrel (NEXPLANON) 68 MG IMPL implant 1 each by Subdermal route once.     hydrOXYzine (VISTARIL) 50 MG capsule Take 50 mg by mouth 3 (three) times daily as needed.     venlafaxine XR (EFFEXOR-XR) 150 MG 24 hr capsule Take 150 mg by mouth daily with breakfast.     venlafaxine XR (EFFEXOR-XR) 75 MG 24 hr capsule Take 75 mg by mouth daily.     VYVANSE 50 MG capsule Take 50 mg by mouth daily.      Lab Results:  Results for orders placed or performed during the hospital encounter of 03/08/22 (from the past 48 hour(s))  Comprehensive metabolic panel     Status: Abnormal   Collection Time: 03/08/22  5:14 PM  Result Value Ref Range   Sodium 136 135 - 145 mmol/L   Potassium 3.6 3.5 - 5.1 mmol/L   Chloride 106 98 - 111 mmol/L   CO2 19 (L) 22 - 32 mmol/L   Glucose, Bld 86 70 - 99 mg/dL    Comment: Glucose reference range applies only to samples taken  after fasting for at least 8 hours.   BUN 10 6 - 20 mg/dL   Creatinine, Ser 9.52 0.44 - 1.00 mg/dL   Calcium 9.6 8.9 - 84.1 mg/dL   Total Protein 7.6 6.5 - 8.1 g/dL   Albumin 4.1 3.5 - 5.0 g/dL   AST 20 15 - 41 U/L   ALT 17 0 - 44 U/L   Alkaline Phosphatase 70 38 - 126 U/L   Total Bilirubin 0.3 0.3 - 1.2 mg/dL   GFR, Estimated >32 >44 mL/min    Comment: (NOTE) Calculated using the CKD-EPI Creatinine Equation (2021)    Anion gap 11 5 - 15    Comment: Performed at [facility], [address] [place], [place] [address]  cbc     Status: Abnormal   Collection Time: 03/08/22  5:14 PM  Result Value Ref Range   WBC 14.3 (H) 4.0 - 10.5 K/uL   RBC 5.39 (H) 3.87 - 5.11 MIL/uL   Hemoglobin 15.5 (H) 12.0 - 15.0 g/dL   HCT 25.3 (H) 66.4 - 40.3 %   MCV 91.1 80.0 - 100.0 fL   MCH 28.8 26.0 - 34.0 pg   MCHC 31.6 30.0 - 36.0 g/dL   RDW 47.4 25.9 - 56.3 %   Platelets 350 150 - 400 K/uL   nRBC 0.0 0.0 - 0.2 %    Comment: Performed at [facility], [address] [place], [place] [address]  Rapid urine drug screen (hospital performed)     Status: Abnormal   Collection Time: 03/08/22  5:17 PM  Result Value Ref Range   Opiates NONE DETECTED NONE DETECTED   Cocaine NONE DETECTED NONE DETECTED   Benzodiazepines NONE DETECTED NONE DETECTED   Amphetamines POSITIVE (A) NONE DETECTED   Tetrahydrocannabinol POSITIVE (A) NONE DETECTED   Barbiturates NONE DETECTED NONE DETECTED    Comment: (NOTE) DRUG SCREEN FOR MEDICAL PURPOSES ONLY.  IF CONFIRMATION IS NEEDED FOR  ANY PURPOSE, NOTIFY LAB WITHIN 5 DAYS.  LOWEST DETECTABLE LIMITS FOR URINE DRUG SCREEN Drug Class                     Cutoff (ng/mL) Amphetamine and metabolites    1000 Barbiturate and metabolites    200 Benzodiazepine                 200 Opiates and metabolites        300 Cocaine and metabolites        300 THC                            50 Performed at [facility], [address], [place], [place] [address]   I-Stat beta hCG blood, ED     Status: None   Collection Time: 03/08/22  5:23 PM  Result Value Ref Range   I-stat hCG, quantitative <5.0 <5 mIU/mL   Comment 3            Comment:   GEST. AGE      CONC.  (mIU/mL)   <=1 WEEK        5 - 50     2 WEEKS       50 - 500     3 WEEKS       100 - 10,000     4 WEEKS     1,000 - 30,000        FEMALE AND NON-PREGNANT FEMALE:     LESS THAN 5 mIU/mL   Resp panel by RT-PCR (RSV, Flu A&B, Covid) Anterior Nasal Swab     Status: None   Collection Time: 03/08/22  6:48 PM   Specimen: Anterior Nasal Swab  Result Value Ref Range   SARS Coronavirus 2 by RT PCR NEGATIVE NEGATIVE    Comment: (NOTE) SARS-CoV-2 target nucleic acids are NOT DETECTED.  The SARS-CoV-2 RNA is generally detectable in upper respiratory specimens during the acute phase of infection. The lowest concentration of SARS-CoV-2 viral copies this assay can detect is 138 copies/mL. A negative result does not preclude SARS-Cov-2 infection and should not be used as the sole basis for treatment or other patient management decisions. A negative result may occur with  improper specimen collection/handling, submission of specimen other than nasopharyngeal swab, presence of viral mutation(s) within the areas targeted by this assay, and inadequate number of viral copies(<138 copies/mL). A negative result must be combined with clinical observations, patient history, and epidemiological information. The expected result is Negative.  Fact Sheet for Patients:  BloggerCourse.com  Fact Sheet for Healthcare Providers:  SeriousBroker.it  This test is no t yet approved or cleared by the [place] FDA and  has been authorized for detection and/or diagnosis of SARS-CoV-2 by FDA under an Emergency Use Authorization (EUA). This EUA will remain  in effect (meaning this test can be used) for the duration of  the COVID-19 declaration under Section 564(b)(1) of the Act, 21 U.S.C.section 360bbb-3(b)(1), unless the authorization is terminated  or revoked sooner.       Influenza A by PCR NEGATIVE NEGATIVE   Influenza B by PCR NEGATIVE NEGATIVE    Comment: (NOTE) The Xpert Xpress SARS-CoV-2/FLU/RSV plus assay is intended as an aid in the diagnosis of influenza from Nasopharyngeal swab specimens and should not be used as a sole basis for treatment. Nasal washings and aspirates are unacceptable for Xpert Xpress SARS-CoV-2/FLU/RSV testing.  Fact  Sheet for Patients: BloggerCourse.com  Fact Sheet for Healthcare Providers: SeriousBroker.it  This test is not yet approved or cleared by the [place] FDA and has been authorized for detection and/or diagnosis of SARS-CoV-2 by FDA under an Emergency Use Authorization (EUA). This EUA will remain in effect (meaning this test can be used) for the duration of the COVID-19 declaration under Section 564(b)(1) of the Act, 21 U.S.C. section 360bbb-3(b)(1), unless the authorization is terminated or revoked.     Resp Syncytial Virus by PCR NEGATIVE NEGATIVE    Comment: (NOTE) Fact Sheet for Patients: BloggerCourse.com  Fact Sheet for Healthcare Providers: SeriousBroker.it  This test is not yet approved or cleared by the [place] FDA and has been authorized for detection and/or diagnosis of SARS-CoV-2 by FDA under an Emergency Use Authorization (EUA). This EUA will remain in effect (meaning this test can be used) for the duration of the COVID-19 declaration under Section 564(b)(1) of the Act, 21 U.S.C. section 360bbb-3(b)(1), unless the authorization is terminated or revoked.  Performed at [facility], [address], [place], [place] [address]   Urinalysis, Routine w reflex microscopic     Status: Abnormal   Collection  Time: 03/08/22  7:39 PM  Result Value Ref Range   Color, Urine YELLOW YELLOW   APPearance CLEAR CLEAR   Specific Gravity, Urine 1.015 1.005 - 1.030   pH 5.0 5.0 - 8.0   Glucose, UA NEGATIVE NEGATIVE mg/dL   Hgb urine dipstick SMALL (A) NEGATIVE   Bilirubin Urine NEGATIVE NEGATIVE   Ketones, ur NEGATIVE NEGATIVE mg/dL   Protein, ur NEGATIVE NEGATIVE mg/dL   Nitrite NEGATIVE NEGATIVE   Leukocytes,Ua NEGATIVE NEGATIVE   RBC / HPF 0-5 0 - 5 RBC/hpf   WBC, UA 0-5 0 - 5 WBC/hpf   Bacteria, UA RARE (A) NONE SEEN   Squamous Epithelial / LPF 0-5 0 - 5   Mucus PRESENT    Hyaline Casts, UA PRESENT     Comment: Performed at [facility], [address], [place], [place] [address]  Acetaminophen level     Status: Abnormal   Collection Time: 03/08/22 11:16 PM  Result Value Ref Range   Acetaminophen (Tylenol), Serum <10 (L) 10 - 30 ug/mL    Comment: (NOTE) Therapeutic concentrations vary significantly. A range of 10-30 ug/mL  may be an effective concentration for many patients. However, some  are best treated at concentrations outside of this range. Acetaminophen concentrations >150 ug/mL at 4 hours after ingestion  and >50 ug/mL at 12 hours after ingestion are often associated with  toxic reactions.  Performed at [facility], [address], [place], [place] [address]   Ethanol     Status: None   Collection Time: 03/08/22 11:16 PM  Result Value Ref Range   Alcohol, Ethyl (B) <10 <10 mg/dL    Comment: (NOTE) Lowest detectable limit for serum alcohol is 10 mg/dL.  For medical purposes only. Performed at [facility], [address] [place], [place] [address]   Salicylate level     Status: Abnormal   Collection Time: 03/08/22 11:16 PM  Result Value Ref Range   Salicylate Lvl <7.0 (L) 7.0 - 30.0 mg/dL    Comment: Performed at [facility], [address], [place], [place] [address]    Blood Alcohol level:   Lab Results  Component Value Date   [facility] <10 03/08/2022   ETH <10 04/27/2020    Physical Findings:  CIWA:    COWS:     Musculoskeletal: Strength & Muscle Tone: within normal limits Gait & Station: normal Patient leans: N/A  Psychiatric Specialty Exam:  Presentation  General Appearance:  Appropriate for Environment  Eye Contact: Fleeting  Speech: Normal Rate  Speech Volume: Increased  Handedness: Right   Mood and Affect  Mood: Anxious; Hopeless  Affect: Flat; Tearful   Thought Process  Thought Processes: Coherent  Descriptions of Associations:Intact  Orientation:Full (Time, Place and Person)  Thought Content:Scattered  History of Schizophrenia/Schizoaffective disorder:No data recorded Duration of Psychotic Symptoms:No data recorded Hallucinations:Hallucinations: None  Ideas of Reference:None  Suicidal Thoughts:Suicidal Thoughts: No  Homicidal Thoughts:Homicidal Thoughts: No   Sensorium  Memory: Immediate Good; Remote Fair  Judgment: Fair  Insight: Fair   Art therapist  Concentration: Fair  Attention Span: Fair  Recall: Fair  Fund of Knowledge: Fair  Language: Good   Psychomotor Activity  Psychomotor Activity: Psychomotor Activity: Normal   Assets  Assets: Communication Skills; Financial Resources/Insurance; Housing; Social Support   Sleep  Sleep: Sleep: Good    Physical Exam: Physical Exam Cardiovascular:     Rate and Rhythm: Normal rate.  Pulmonary:     Effort: Pulmonary effort is normal.  Musculoskeletal:        General: Normal range of motion.     Cervical back: Normal range of motion.  Skin:    General: Skin is warm.  Neurological:     Mental Status: She is alert.  Psychiatric:        Attention and Perception: Attention normal.        Mood and Affect: Mood is anxious. Affect is flat and tearful.        Speech: Speech normal.        Behavior: Behavior is cooperative.        Thought  Content: Thought content normal.        Cognition and Memory: Cognition normal.    Review of Systems  Constitutional: Negative.   Respiratory: Negative.    Genitourinary: Negative.   Skin: Negative.   Psychiatric/Behavioral:  Positive for depression.    Blood pressure 116/68, pulse 74, temperature 98.5 F (36.9 C), temperature source Oral, resp. rate 18, height 5\' 6"  (1.676 m), weight 94.3 kg, SpO2 99 %. Body mass index is 33.57 kg/m.   Medical Decision Making: Patient continues to require inpatient Psychiatric hospitalization. Patient has a bed at [facility], awaiting on sheriff. Patient given Ativan 1 mg for anxiety.   Kaylor Simenson MOTLEY-MANGRUM, PMHNP 03/10/2022, 12:52 PM

## 2022-03-10 NOTE — ED Notes (Signed)
Pt extremely tearful and crying about having to go to inpatient, mom at bedside attempted to calm pt down.

## 2022-03-10 NOTE — ED Notes (Signed)
Family called requesting to talk to provider about pt placement. Provider notified and called family.

## 2022-03-10 NOTE — ED Notes (Signed)
Psych NP at bedside

## 2022-03-10 NOTE — ED Provider Notes (Signed)
Emergency Medicine Observation Re-evaluation Note  Dana Thompson is a 21 y.o. female, seen on rounds today.  Pt initially presented to the ED for complaints of Suicidal Currently, the patient is sleeping.  Physical Exam  BP 116/68 (BP Location: Right Arm)   Pulse 74   Temp 98.5 F (36.9 C) (Oral)   Resp 18   Ht 5\' 6"  (1.676 m)   Wt 94.3 kg   SpO2 99%   BMI 33.57 kg/m  Physical Exam General: No acute distress Cardiac: Normal rate Lungs: No increased work of breathing Psych: Calm  ED Course / MDM  EKG:EKG Interpretation  Date/Time:  Thursday March 08 2022 22:32:35 EST Ventricular Rate:  63 PR Interval:  154 QRS Duration: 94 QT Interval:  400 QTC Calculation: 409 R Axis:   80 Text Interpretation: Normal sinus rhythm with sinus arrhythmia Normal ECG When compared with ECG of 29-Jun-2020 10:45, PREVIOUS ECG IS PRESENT similar to prev no stemi Confirmed by 01-Jul-2020 ([id number]) on 03/08/2022 10:42:47 PM  I have reviewed the labs performed to date as well as medications administered while in observation.  Recent changes in the last 24 hours include none.  Plan  Current plan is for awaiting placement.    03/10/2022, MD 03/10/22 [phone]

## 2022-03-10 NOTE — Progress Notes (Signed)
Per Geralynn Ochs Motley-Mangrum,PMHNP, patient meets criteria for inpatient treatment. There are no available beds at [facility] today. CSW faxed referrals to the following facilities for review:  [facility]  Pending - Request Sent N/A [address] [place] [address] [phone] --  [facility]  Pending - Request Sent N/A [address], [place] [address] [phone] --  [facility]  Pending - Request Sent N/A [address] [place], [place] [address] [phone] --  [facility]  Pending - Request Sent N/A [address], [place] [address] [phone] --  [facility]  Pending - Request Sent N/A [address] [place] [address] [phone] --  [facility]  Pending - Request Sent N/A [address] [place], [place] [address] [phone] --  [facility]  Pending - Request Sent N/A [address], [place] [address] [phone] --  [facility]  Pending - Request Sent N/A [address] [place] [address] [phone] --  [facility]  Pending - Request Sent N/A [address], [place] [address] [phone] --  [facility]  Pending - Request Sent N/A [address], [place] [address] [phone] --  [facility]  Pending - Request Sent N/A [address] [place] [address] [phone] --  [facility]  Pending - Request Sent N/A [address], [place] [address] [phone] --  [facility]  Pending - Request Sent N/A [address] [place] [address] [phone] --  [facility]  Pending - Request Sent N/A [address],  [name] [address] [phone] --  [facility]  Pending - Request Sent N/A [address], [place] [address] [phone] --  [facility]  Pending - Request Sent N/A [address] [place] [address] [phone] --  [facility]  Pending - Request Sent N/A [address], [place] [address] [phone] --  [facility]  Pending - Request Sent N/A [address] [place] [address] [phone] --  [facility]  Pending - Request Sent N/A [address] [place], [place] [address] [phone] --  [facility]  Pending - Request Sent N/A [address] [place] [address] [phone] --  [facility]  Pending - Request Sent N/A [phone] [place] [address] [phone] --  [facility]  Pending - Request Sent N/A [address] [place], [place] [address] [phone] --  [facility]  Pending - Request Sent N/A [address], [place] [address] [phone] --   TTS will continue to seek bed placement.  Crissie Reese, MSW, Lenice Pressman Phone: [phone] Disposition/TOC

## 2022-03-11 MED ORDER — LORAZEPAM 1 MG PO TABS
1.0000 mg | ORAL_TABLET | ORAL | Status: DC | PRN
  Administered 2022-03-11: 1 mg via ORAL
  Filled 2022-03-11: qty 1

## 2022-03-11 NOTE — ED Notes (Signed)
[name] taking pt belongings home with him. Pt aware.

## 2022-03-11 NOTE — Discharge Instructions (Addendum)

## 2022-03-11 NOTE — ED Notes (Signed)
Called and left a message for the [facility] department about needing transportation for patient

## 2022-03-11 NOTE — ED Notes (Signed)
Spoke with Aggie Cosier at [facility] and gave her report on patient

## 2022-03-11 NOTE — ED Provider Notes (Addendum)
Emergency Medicine Observation Re-evaluation Note  Dana Thompson is a 21 y.o. female, seen on rounds today.  Pt initially presented to the ED for complaints of Suicidal Currently, the patient is accepted for admission to [facility].  Physical Exam  BP 133/72 (BP Location: Right Arm)   Pulse 92   Temp 98.6 F (37 C) (Oral)   Resp 18   Ht 5\' 6"  (1.676 m)   Wt 94.3 kg   SpO2 98%   BMI 33.57 kg/m  Physical Exam General: Alert nontoxic well in appearance Cardiac: Regular.  No rub murmur gallop.  Lower extremities no peripheral edema.  Calves soft and nontender. Lungs: Clear to auscultation. Psych: Alert and cooperative.  ED Course / MDM  EKG:EKG Interpretation  Date/Time:  Thursday March 08 2022 22:32:35 EST Ventricular Rate:  63 PR Interval:  154 QRS Duration: 94 QT Interval:  400 QTC Calculation: 409 R Axis:   80 Text Interpretation: Normal sinus rhythm with sinus arrhythmia Normal ECG When compared with ECG of 29-Jun-2020 10:45, PREVIOUS ECG IS PRESENT similar to prev no stemi Confirmed by 01-Jul-2020 ([id number]) on 03/08/2022 10:42:47 PM  I have reviewed the labs performed to date as well as medications administered while in observation.  Recent changes in the last 24 hours include patient accepted for transfer..  Plan  Current plan is for transfer by [facility] to [facility] for suicidal ideation.  Patient is stable for transport EMTALA completed.  Accepted by Dr. [facility]    Guss Bunde, MD 03/11/22 1053  Patient did not wish for inpatient treatment at [facility] and patient's parents were opposed to this placement.  Answer was delayed due to prior IVC paperwork issues and no transportation available.  Subsequently, risk-management and TTS have been reassessing the patient and situation.  At this point TTS has reevaluated the patient and plan is to rescind IVC.    [facility], MD 03/11/22 [phone]

## 2022-03-11 NOTE — ED Notes (Signed)
This Clinical research associate spoke to Pt's mother and father at length about generalized psych hold information d/t not knowing details about the patient or having the patient's permission.  IVC process and IVC transport process was explained at length.  Parents upset because they don't want Pt going to a facility w/o ECT.  It was explained that the Pt must go to the first appropriate facility she is accepted to and sitting in the ED is inappropriate.  Parents requested to speak to psych providers.  This Clinical research associate got permission from Pt to speak in more detail w/ parents.  Spoke to Mother regarding provider notes and appropriateness of Inpatient admission.

## 2022-03-11 NOTE — ED Notes (Addendum)
Pt tearful, expressing that they don't wish to be inpatient. Spoke with pt and explained that with current IVC status they will have to go IP, but once at [facility] they will have a new team who will work out the best treatment plan for them. Pt's mother currently in rm.

## 2022-03-11 NOTE — ED Notes (Signed)
Patient is sleeping at this time and will get VSs when she wakes up

## 2022-03-11 NOTE — ED Notes (Signed)
Pt's mother given car keys from pt belonging bag.

## 2022-03-11 NOTE — ED Notes (Signed)
Spoke with Joni Reining at [facility] and informed her that patient will not be coming today due to paperwork and transportation. She stated that we can call them tomorrow when patient is ready to be transported.

## 2022-03-11 NOTE — ED Notes (Signed)
Spoke with [employer], and he will be coming around 10:30 am to transport patient.

## 2022-03-11 NOTE — ED Notes (Signed)
Spoke with patient's mother about her IVC being rescinded, and she said she will be here to pick her up soon.

## 2022-03-11 NOTE — ED Notes (Signed)
Sheriff arrived and reviewed IVC paperwork. Paper was filled out incorrectly, and sheriff was unable to transport pt to [facility]. [name] arrived, and situation was explained to him. [name] expressed frustration, and confusion with the process. Things were explained to them, and told care team would be contacted.

## 2022-03-11 NOTE — ED Notes (Signed)
Patient is very anxious this morning after waking up and thinking about going to inpatient psych tx today. I have already given hydroxyzine but patient is still very anxious. Notified C. Horton, MD. Will continue to monitor.

## 2022-03-11 NOTE — ED Notes (Signed)
Spoke with pt about situation, Pt became upset and agitated stating "it's not fair". Attempted to deescalate pt. Pt screaming in rm. MD notified.

## 2022-03-11 NOTE — Discharge Summary (Signed)
[facility] Psych ED Discharge  03/11/2022 1:51 PM Dana Thompson  MRN:  [id number]  Principal Problem: Suicide ideation Discharge Diagnoses: Principal Problem:   Suicide ideation Active Problems:   MDD (major depressive disorder), recurrent severe, without psychosis (HCC)  Clinical Impression:  Final diagnoses:  Suicidal intent   ED Assessment Time Calculation: Start Time: 1245 Stop Time: 1330 Total Time in Minutes (Assessment Completion): 45   Subjective:  Dana Thompson is a 21 y.o. female patient brought in by [facility] accompanied by [employer] d/t pt calling 911 and verbalizing SI. Pt was screaming at dispatch incoherently. Initial officers on scene found pt behind wheel of car stating, "I will swerve and end it all." Pt was physically removed from the vehicle by deputies. Pt was combative w/ [employer] attempting to head butt and kick deputies. De escalation done in the field. Pt presents in forensic restraints. Per [facility] pt has long psychiatric history and has been IVC here in the past.    HPI: Dana Thompson, 21 y.o., female patient seen face to face by this provider, consulted with Dr. Lolly Mustache; and chart reviewed on 03/11/22.  On evaluation Dana Thompson reports that she was not happy that she had to stay here longer due to IVC paperwork mishap, she stated that she got upset with the nurse earlier and apologized for yelling. She says that she is bored here and does not have Thompson usual coping skills (Ipad, graphic paper, and beadmaker) items that help soothe and relax Thompson. She discussed with provider that being in this room is not helpful for Thompson and she misses home, she understood how she handled the events with the [employer] before admission was not appropriate. She became tearful and stated she just wanted to go home, stated she will see Thompson psychiatrist Dr. Betti Cruz at [facility]. Patient is still willing to participate in ECT at [facility]. Patient denies SI/HI/AVH and appears to be in  a better mood once she spoke with Thompson parents. She and provider spoke about impulsivity, stress and anxiety, ways to handle each one in a positive manner. Provider informed patient she will give Thompson some handouts to educate.  During evaluation Dana Thompson is sitting on Thompson bed in no acute distress, coloring with hospital crayons and coloring book. She is alert, oriented x 4, calm, cooperative and attentive. Thompson mood is euthymic with congruent affect. She has normal speech, and behavior.  Objectively there is no evidence of psychosis/mania or delusional thinking.  Patient is Thompson to converse coherently, goal directed thoughts, no distractibility, or pre-occupation. Patient appears to have a little more insight into Thompson situation and Thompson to discuss how she could have handled things differently, she felt like being in here has helped Thompson think about things. She also denies suicidal/self-harm/homicidal ideation, psychosis, and paranoia.   At this time Dana Thompson is educated and verbalizes understanding of mental health resources and other crisis services in the community. She is instructed to call 911 and present to the nearest emergency room should she experience any suicidal/homicidal ideation, auditory/visual/hallucinations, or detrimental worsening of Thompson mental health condition. She was a also advised by Clinical research associate that she could call the toll-free phone on back of  insurance card to assist with identifying in network counselors and agencies.  Spoke with patients parents Mr. Dana Thompson and Dana Thompson and they informed hospital staff that they will accept responsibility for patient, they are Thompson to contract for patient safety. Patient father  Mr. Dana Thompson will be taking a leave of absence from his job to ensure patient safety, and patient will be moving in with him, so that he is Thompson to look after Thompson more. Patient mother Dana Thompson is a retired Engineer, civil (consulting) who is Thompson to also take patient to appointments  and ensure Thompson safety. Provider informed them to set up an appointment with patient psychiatrist and with ECT as soon as possible. Parents assured provider they would.  Consulted with Dr. Lolly Mustache on this patient and he agreed that the parents are supportive and are advocates for their child, they need to also ensure Thompson safety and set up appointments with psychiatrist and ECT, and patient is Thompson to be discharged home to parents.      Past Psychiatric History: Anxiety, depression, SI  Past Medical History:  Past Medical History:  Diagnosis Date   ADHD (attention deficit hyperactivity disorder)    Anxiety    Attention deficit disorder    Depression    Irregular heartbeat    Premature Atrial Contractions/ Palpitations   Pre-diabetes    Urinary tract infection    in past, but not currently   History reviewed. No pertinent surgical history. Family History:  Family History  Problem Relation Age of Onset   Cancer Maternal Grandmother    Cancer Paternal Grandmother    Hyperlipidemia Father    Hypertension Father     Social History:  Social History   Substance and Sexual Activity  Alcohol Use Yes   Alcohol/week: 1.0 standard drink of alcohol   Types: 1 Standard drinks or equivalent per week   Comment: Once a month.     Social History   Substance and Sexual Activity  Drug Use Yes   Frequency: 7.0 times per week   Types: Marijuana   Comment: occasionally    Social History   Socioeconomic History   Marital status: Single    Spouse name: Not on file   Number of children: Not on file   Years of education: Not on file   Highest education level: Not on file  Occupational History   Not on file  Tobacco Use   Smoking status: Never   Smokeless tobacco: Never  Vaping Use   Vaping Use: Every day   Devices: nicotine  Substance and Sexual Activity   Alcohol use: Yes    Alcohol/week: 1.0 standard drink of alcohol    Types: 1 Standard drinks or equivalent per week     Comment: Once a month.   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     Lives with father now- 06/29/20   Social Determinants of Health   Financial Resource Strain: Not on file  Food Insecurity: Not on file  Transportation Needs: Not on file  Physical Activity: Not on file  Stress: Not on file  Social Connections: Not on file    Tobacco Cessation:  N/A, patient does not currently use tobacco products  Current Medications: Current Facility-Administered Medications  Medication Dose Route Frequency Provider Last Rate Last Admin   acetaminophen (TYLENOL) tablet 650 mg  650 mg Oral Q6H PRN Rexford Maus, DO  650 mg at 03/10/22 2012   brexpiprazole (REXULTI) tablet 2 mg  2 mg Oral q morning Motley-Mangrum, Kazue Cerro A, PMHNP   2 mg at 03/11/22 0932   hydrOXYzine (ATARAX) tablet 50 mg  50 mg Oral TID PRN Motley-Mangrum, Johathon Overturf A, PMHNP   50 mg at 03/11/22 0425   LORazepam (ATIVAN) tablet 1 mg  1 mg Oral Q4H PRN Arby Barrette, MD   1 mg at 03/11/22 1245   melatonin tablet 3 mg  3 mg Oral QHS Melene Plan, DO   3 mg at 03/10/22 2012   nicotine polacrilex (NICORETTE) gum 2 mg  2 mg Oral Q4H while awake Motley-Mangrum, Cydnie Deason A, PMHNP   2 mg at 03/11/22 0540   venlafaxine XR (EFFEXOR-XR) 24 hr capsule 150 mg  150 mg Oral Q breakfast Motley-Mangrum, Kyrie Bun A, PMHNP   150 mg at 03/11/22 0932   Current Outpatient Medications  Medication Sig Dispense Refill   etonogestrel (NEXPLANON) 68 MG IMPL implant 1 each by Subdermal route once.     hydrOXYzine (VISTARIL) 25 MG capsule Take 25 mg by mouth 2 (two) times daily.     lisdexamfetamine (VYVANSE) 60 MG capsule Take 60 mg by mouth in the morning.     REXULTI 2 MG TABS tablet Take 2  mg by mouth every morning.     venlafaxine XR (EFFEXOR-XR) 150 MG 24 hr capsule Take 150 mg by mouth daily with breakfast.     PTA Medications: (Not in a hospital admission)   [place] Scale:  Flowsheet Row ED from 03/08/2022 in [facility] from 03/17/2021 in [facility] E[facility] Treatment from 09/16/2020 in [facility]  C-SSRS RISK CATEGORY Error: Q3, 4, or 5 should not be populated when Q2 is No No Risk Error: Q3, 4, or 5 should not be populated when Q2 is No       Musculoskeletal: Strength & Muscle Tone: within normal limits Gait & Station: normal Patient leans: N/A  Psychiatric Specialty Exam: Presentation  General Appearance:  Appropriate for Environment  Eye Contact: Good  Speech: Clear and Coherent  Speech Volume: Normal  Handedness: Right   Mood and Affect  Mood: Euthymic  Affect: Appropriate   Thought Process  Thought Processes: Coherent  Descriptions of Associations:Intact  Orientation:Full (Time, Place and Person)  Thought Content:WDL  History of Schizophrenia/Schizoaffective disorder:No data recorded Duration of Psychotic Symptoms:No data recorded Hallucinations:Hallucinations: None  Ideas of Reference:None  Suicidal Thoughts:Suicidal Thoughts: No  Homicidal Thoughts:Homicidal Thoughts: No   Sensorium  Memory: Immediate Good; Remote Fair  Judgment: Good  Insight: Fair   Executive Functions  Concentration: Good  Attention Span: Good  Recall: Fair  Fund of Knowledge: Good  Language: Good   Psychomotor Activity  Psychomotor Activity: Psychomotor Activity: Normal   Assets  Assets: Communication Skills; Desire for Improvement; Housing; Health and safety inspector; Social Support   Sleep  Sleep: Sleep: Good    Physical Exam: Physical Exam HENT:     Head: Normocephalic.  Cardiovascular:     Rate and Rhythm:  Normal rate.  Pulmonary:     Effort: Pulmonary effort is normal.  Musculoskeletal:        General: Normal range of motion.  Skin:    General: Skin is warm.  Neurological:     Mental Status: She is alert.  Psychiatric:        Mood and Affect: Mood normal.        Behavior: Behavior normal.  Thought Content: Thought content normal.        Judgment: Judgment normal.    Review of Systems  Constitutional: Negative.   Respiratory: Negative.    Gastrointestinal: Negative.   Musculoskeletal: Negative.   Skin: Negative.   Psychiatric/Behavioral: Negative.     Blood pressure 133/72, pulse 92, temperature 98.6 F (37 C), temperature source Oral, resp. rate 18, height 5\' 6"  (1.676 m), weight 94.3 kg, SpO2 98 %. Body mass index is 33.57 kg/m.   Demographic Factors:  Adolescent or young adult and Caucasian  Loss Factors: NA  Historical Factors: Impulsivity  Risk Reduction Factors:   Employed, Living with another person, especially a relative, Positive social support, Positive therapeutic relationship, and Positive coping skills or problem solving skills  Continued Clinical Symptoms:  Severe Anxiety and/or Agitation Depression:   Impulsivity More than one psychiatric diagnosis  Cognitive Features That Contribute To Risk:  Thought constriction (tunnel vision)    Suicide Risk:  Minimal: No identifiable suicidal ideation.  Patients presenting with no risk factors but with morbid ruminations; may be classified as minimal risk based on the severity of the depressive symptoms   Follow-up Information     [facility]. Schedule an appointment as soon as possible for a visit.   Why: To make an evaluation for ECT        Center, [facility]. Schedule an appointment as soon as possible for a visit.   Specialty: Behavioral Health Why: as soon as possible Contact information: [address] [place]  [id number] [phone]                 Medical Decision Making: At time of discharge, patient denies SI, HI, AVH and is Thompson to contract for safety. She demonstrated no overt evidence of psychosis or mania. Prior to discharge, Kazi verbalized that she understood warning signs, triggers, and symptoms of worsening mental health and how to access emergency mental health care if they felt it was needed. Patient was instructed to call 911 or return to the emergency room if they experienced any concerning symptoms after discharge. Patient voiced understanding and agreed to the above. Spoke with Dr. Lolly MustacheArfeen, and it is agreed patient is psychiatrically cleared.    Disposition: Disposition: Patient does not meet criteria for psychiatric inpatient admission. Supportive therapy provided about ongoing stressors. Discussed crisis plan, support from social network, calling 911, coming to the Emergency Department, and calling Suicide Hotline.   Adraine Biffle MOTLEY-MANGRUM, PMHNP 03/11/2022, 1:51 PM

## 2022-03-11 NOTE — ED Notes (Signed)
IVC has been rescinded 

## 2022-07-30 ENCOUNTER — Encounter (HOSPITAL_COMMUNITY): Payer: Self-pay

## 2022-08-03 NOTE — Telephone Encounter (Signed)
 Called by Saralyn Greener, CNA  Call Made:Yes  Patient reached:No    Additional call back comments:

## 2023-05-23 ENCOUNTER — Emergency Department (HOSPITAL_COMMUNITY): Payer: Commercial Managed Care - PPO

## 2023-05-23 ENCOUNTER — Other Ambulatory Visit: Payer: Self-pay

## 2023-05-23 ENCOUNTER — Encounter (HOSPITAL_COMMUNITY): Payer: Self-pay

## 2023-05-23 ENCOUNTER — Emergency Department (HOSPITAL_COMMUNITY)
Admission: EM | Admit: 2023-05-23 | Discharge: 2023-05-23 | Disposition: A | Discharge status: Home / Self Care | Payer: Commercial Managed Care - PPO | Attending: Emergency Medicine | Admitting: Emergency Medicine

## 2023-05-23 DIAGNOSIS — Z5321 Procedure and treatment not carried out due to patient leaving prior to being seen by health care provider: Secondary | ICD-10-CM | POA: Diagnosis not present

## 2023-05-23 DIAGNOSIS — R509 Fever, unspecified: Secondary | ICD-10-CM | POA: Diagnosis present

## 2023-05-23 DIAGNOSIS — R55 Syncope and collapse: Secondary | ICD-10-CM | POA: Insufficient documentation

## 2023-05-23 DIAGNOSIS — R42 Dizziness and giddiness: Secondary | ICD-10-CM | POA: Diagnosis not present

## 2023-05-23 LAB — BASIC METABOLIC PANEL
Anion gap: 13 (ref 5–15)
BUN: 8 mg/dL (ref 6–20)
CO2: 20 mmol/L — ABNORMAL LOW (ref 22–32)
Calcium: 8.6 mg/dL — ABNORMAL LOW (ref 8.9–10.3)
Chloride: 103 mmol/L (ref 98–111)
Creatinine, Ser: 0.84 mg/dL (ref 0.44–1.00)
GFR, Estimated: >60 mL/min (ref >60)
Glucose, Bld: 120 mg/dL — ABNORMAL HIGH (ref 70–99)
Potassium: 3.9 mmol/L (ref 3.5–5.1)
Sodium: 136 mmol/L (ref 135–145)

## 2023-05-23 LAB — CBC
HCT: 43.8 % (ref 36.0–46.0)
Hemoglobin: 14.3 g/dL (ref 12.0–15.0)
MCH: 29.2 pg (ref 26.0–34.0)
MCHC: 32.6 g/dL (ref 30.0–36.0)
MCV: 89.4 fL (ref 80.0–100.0)
Platelets: 302 10*3/uL (ref 150–400)
RBC: 4.9 MIL/uL (ref 3.87–5.11)
RDW: 13.2 % (ref 11.5–15.5)
WBC: 6.1 10*3/uL (ref 4.0–10.5)
nRBC: 0 % (ref 0.0–0.2)

## 2023-05-23 LAB — HCG, SERUM, QUALITATIVE: Preg, Serum: NEGATIVE

## 2023-05-23 LAB — CBG MONITORING, ED: Glucose-Capillary: 134 mg/dL — ABNORMAL HIGH (ref 70–99)

## 2023-05-23 NOTE — ED Provider Triage Note (Signed)
 Emergency Medicine Provider Triage Evaluation Note  Dana Thompson , a 23 y.o. female  was evaluated in triage.  Pt complains of possible seizure. She is currently on Tamiflu, finally stopped having a fever today for recent flu illness.  Was standing and developed a lightheadedness feeling and passed out and her dad caught her.  When they went outside she then started stiffening and her head turned and she was grunting and seem like she was having a seizure.  Lasted less than 30 seconds.  Review of Systems  Positive: Syncope/seizure Negative: Chest pain, headache  Physical Exam  BP 106/72   Pulse 88   Temp 98.3 F (36.8 C) (Oral)   Resp 18   Wt 97.5 kg   SpO2 93%   BMI 34.70 kg/m  Gen:   Awake, no distress   Card:  No murmur Neuro:   CN 3-12 grossly intact. 5/5 strength in all 4 extremities. Grossly normal sensation. Normal finger to nose.    Medical Decision Making  Medically screening exam initiated at 2:51 PM.  Appropriate orders placed.  Dana Thompson was informed that the remainder of the evaluation will be completed by another provider, this initial triage assessment does not replace that evaluation, and the importance of remaining in the ED until their evaluation is complete.  Could be cardiogenic syncope versus a possible seizure.  Will investigate with labs and CT and ECG.   Dana Loveless, MD 05/23/23 [phone]

## 2023-05-23 NOTE — ED Notes (Signed)
 [name] arrived to sort desk asking when pt will be roomed, is frustrated. This RN explained that there are currently no rooms and pt's care is a priority. He stated that pt may leave

## 2023-05-23 NOTE — ED Triage Notes (Signed)
 Pt bib ems from parking lot of restaurant; last 3 days has had flu sx; decreased po intake; n/v, fatigue; went to lunch today, waiting to be seated; began feeling dizzy lightheaded; had syncopal episode, was lowered to floor, ; became unresponsive; pt's father endorses seizure like activity lasting 15-30 seconds, describes as shaking all over; several minutes of disorientation following seizure; no seizure hx; 82/50 initially; 500 ns given pta, last bp 106/72; c/o generalized weakness and generalized pain; 20 ga rac; pt a and o x 4 at present, 2/10 pain

## 2023-05-23 NOTE — ED Notes (Signed)
 Pt has decided to leave. IV removed, pt advised that she can follow up with PCP and MyChart results

## 2023-10-11 ENCOUNTER — Encounter: Payer: Self-pay | Admitting: Advanced Practice Midwife

## 2024-01-28 ENCOUNTER — Other Ambulatory Visit (HOSPITAL_BASED_OUTPATIENT_CLINIC_OR_DEPARTMENT_OTHER): Payer: Self-pay

## 2024-01-29 ENCOUNTER — Encounter (HOSPITAL_COMMUNITY): Payer: Self-pay | Admitting: Family Medicine

## 2024-01-29 ENCOUNTER — Other Ambulatory Visit (HOSPITAL_BASED_OUTPATIENT_CLINIC_OR_DEPARTMENT_OTHER): Payer: Self-pay

## 2024-01-29 ENCOUNTER — Emergency Department (HOSPITAL_BASED_OUTPATIENT_CLINIC_OR_DEPARTMENT_OTHER): Admitting: Radiology

## 2024-01-29 ENCOUNTER — Other Ambulatory Visit: Payer: Self-pay

## 2024-01-29 ENCOUNTER — Inpatient Hospital Stay (HOSPITAL_BASED_OUTPATIENT_CLINIC_OR_DEPARTMENT_OTHER)
Admission: EM | Admit: 2024-01-29 | Discharge: 2024-01-31 | DRG: 189 | Disposition: A | Discharge status: Home / Self Care | Attending: Internal Medicine | Admitting: Internal Medicine

## 2024-01-29 DIAGNOSIS — J9601 Acute respiratory failure with hypoxia: Secondary | ICD-10-CM | POA: Diagnosis not present

## 2024-01-29 DIAGNOSIS — R000 Tachycardia, unspecified: Secondary | ICD-10-CM | POA: Diagnosis present

## 2024-01-29 DIAGNOSIS — E669 Obesity, unspecified: Secondary | ICD-10-CM | POA: Diagnosis present

## 2024-01-29 DIAGNOSIS — R0602 Shortness of breath: Secondary | ICD-10-CM | POA: Diagnosis present

## 2024-01-29 DIAGNOSIS — F909 Attention-deficit hyperactivity disorder, unspecified type: Secondary | ICD-10-CM | POA: Diagnosis present

## 2024-01-29 DIAGNOSIS — F419 Anxiety disorder, unspecified: Secondary | ICD-10-CM | POA: Diagnosis present

## 2024-01-29 DIAGNOSIS — R030 Elevated blood-pressure reading, without diagnosis of hypertension: Secondary | ICD-10-CM | POA: Diagnosis present

## 2024-01-29 DIAGNOSIS — J209 Acute bronchitis, unspecified: Secondary | ICD-10-CM | POA: Diagnosis not present

## 2024-01-29 DIAGNOSIS — F17290 Nicotine dependence, other tobacco product, uncomplicated: Secondary | ICD-10-CM | POA: Diagnosis present

## 2024-01-29 DIAGNOSIS — Z79899 Other long term (current) drug therapy: Secondary | ICD-10-CM

## 2024-01-29 DIAGNOSIS — Z8249 Family history of ischemic heart disease and other diseases of the circulatory system: Secondary | ICD-10-CM

## 2024-01-29 DIAGNOSIS — D72829 Elevated white blood cell count, unspecified: Secondary | ICD-10-CM | POA: Insufficient documentation

## 2024-01-29 DIAGNOSIS — Z1152 Encounter for screening for COVID-19: Secondary | ICD-10-CM

## 2024-01-29 DIAGNOSIS — F332 Major depressive disorder, recurrent severe without psychotic features: Secondary | ICD-10-CM | POA: Diagnosis present

## 2024-01-29 DIAGNOSIS — Z6838 Body mass index (BMI) 38.0-38.9, adult: Secondary | ICD-10-CM

## 2024-01-29 LAB — RESP PANEL BY RT-PCR (RSV, FLU A&B, COVID)  RVPGX2
Influenza A by PCR: NEGATIVE
Influenza B by PCR: NEGATIVE
Resp Syncytial Virus by PCR: NEGATIVE
SARS Coronavirus 2 by RT PCR: NEGATIVE

## 2024-01-29 LAB — D-DIMER, QUANTITATIVE: D-Dimer, Quant: <0.27 ug{FEU}/mL (ref 0.00–0.50)

## 2024-01-29 LAB — CBC WITH DIFFERENTIAL/PLATELET
Abs Immature Granulocytes: 0.05 K/uL (ref 0.00–0.07)
Basophils Absolute: 0.1 K/uL (ref 0.0–0.1)
Basophils Relative: 1 %
Eosinophils Absolute: 0.4 K/uL (ref 0.0–0.5)
Eosinophils Relative: 3 %
HCT: 44 % (ref 36.0–46.0)
Hemoglobin: 14.8 g/dL (ref 12.0–15.0)
Immature Granulocytes: 0 %
Lymphocytes Relative: 13 %
Lymphs Abs: 1.7 K/uL (ref 0.7–4.0)
MCH: 29.7 pg (ref 26.0–34.0)
MCHC: 33.6 g/dL (ref 30.0–36.0)
MCV: 88.2 fL (ref 80.0–100.0)
Monocytes Absolute: 1.2 K/uL — ABNORMAL HIGH (ref 0.1–1.0)
Monocytes Relative: 9 %
Neutro Abs: 9.8 K/uL — ABNORMAL HIGH (ref 1.7–7.7)
Neutrophils Relative %: 74 %
Platelets: 306 K/uL (ref 150–400)
RBC: 4.99 MIL/uL (ref 3.87–5.11)
RDW: 13.3 % (ref 11.5–15.5)
WBC: 13.2 K/uL — ABNORMAL HIGH (ref 4.0–10.5)
nRBC: 0 % (ref 0.0–0.2)

## 2024-01-29 LAB — TSH: TSH: 1.02 u[IU]/mL (ref 0.350–4.500)

## 2024-01-29 LAB — CBC
HCT: 44.6 % (ref 36.0–46.0)
Hemoglobin: 14 g/dL (ref 12.0–15.0)
MCH: 28.8 pg (ref 26.0–34.0)
MCHC: 31.4 g/dL (ref 30.0–36.0)
MCV: 91.8 fL (ref 80.0–100.0)
Platelets: 327 K/uL (ref 150–400)
RBC: 4.86 MIL/uL (ref 3.87–5.11)
RDW: 13.6 % (ref 11.5–15.5)
WBC: 12.3 K/uL — ABNORMAL HIGH (ref 4.0–10.5)
nRBC: 0 % (ref 0.0–0.2)

## 2024-01-29 LAB — BASIC METABOLIC PANEL WITH GFR
Anion gap: 15 (ref 5–15)
BUN: 7 mg/dL (ref 6–20)
CO2: 20 mmol/L — ABNORMAL LOW (ref 22–32)
Calcium: 10 mg/dL (ref 8.9–10.3)
Chloride: 103 mmol/L (ref 98–111)
Creatinine, Ser: 0.79 mg/dL (ref 0.44–1.00)
GFR, Estimated: >60 mL/min (ref >60)
Glucose, Bld: 104 mg/dL — ABNORMAL HIGH (ref 70–99)
Potassium: 4 mmol/L (ref 3.5–5.1)
Sodium: 137 mmol/L (ref 135–145)

## 2024-01-29 LAB — CREATININE, SERUM
Creatinine, Ser: 0.81 mg/dL (ref 0.44–1.00)
GFR, Estimated: >60 mL/min (ref >60)

## 2024-01-29 LAB — HCG, SERUM, QUALITATIVE: Preg, Serum: NEGATIVE

## 2024-01-29 MED ORDER — LISDEXAMFETAMINE DIMESYLATE 50 MG PO CAPS
50.0000 mg | ORAL_CAPSULE | Freq: Every day | ORAL | 0 refills | Status: DC
  Filled 2024-01-29: qty 30, 30d supply, fill #0

## 2024-01-29 MED ORDER — IPRATROPIUM-ALBUTEROL 0.5-2.5 (3) MG/3ML IN SOLN: 3.0000 mL | RESPIRATORY_TRACT | Status: DC | PRN

## 2024-01-29 MED ORDER — VENLAFAXINE HCL ER 150 MG PO CP24: 150.0000 mg | ORAL_CAPSULE | Freq: Every day | ORAL | Status: DC

## 2024-01-29 MED ORDER — ALBUTEROL SULFATE (2.5 MG/3ML) 0.083% IN NEBU
10.0000 mg | INHALATION_SOLUTION | Freq: Once | RESPIRATORY_TRACT | Status: AC
  Administered 2024-01-29: 10 mg via RESPIRATORY_TRACT

## 2024-01-29 MED ORDER — ALBUTEROL SULFATE (2.5 MG/3ML) 0.083% IN NEBU: 2.5000 mg | INHALATION_SOLUTION | RESPIRATORY_TRACT | Status: DC | PRN

## 2024-01-29 MED ORDER — L-METHYLFOLATE 7.5 MG PO TABS: 2.0000 | ORAL_TABLET | Freq: Every day | ORAL | Status: DC

## 2024-01-29 MED ORDER — ALBUTEROL (5 MG/ML) CONTINUOUS INHALATION SOLN: 10.0000 mg/h | INHALATION_SOLUTION | Freq: Once | RESPIRATORY_TRACT | Status: DC

## 2024-01-29 MED ORDER — ENOXAPARIN SODIUM 40 MG/0.4ML IJ SOSY
40.0000 mg | PREFILLED_SYRINGE | INTRAMUSCULAR | Status: DC
  Administered 2024-01-29: 40 mg via SUBCUTANEOUS
  Filled 2024-01-29: qty 0.4

## 2024-01-29 MED ORDER — METHYLPREDNISOLONE SODIUM SUCC 40 MG IJ SOLR
40.0000 mg | Freq: Two times a day (BID) | INTRAMUSCULAR | Status: DC
  Administered 2024-01-30 – 2024-01-31 (×3): 40 mg via INTRAVENOUS
  Filled 2024-01-29 (×3): qty 1

## 2024-01-29 MED ORDER — SODIUM CHLORIDE 0.9 % IV BOLUS
1000.0000 mL | Freq: Once | INTRAVENOUS | Status: AC
  Administered 2024-01-29: 1000 mL via INTRAVENOUS

## 2024-01-29 MED ORDER — VENLAFAXINE HCL ER 75 MG PO CP24
225.0000 mg | ORAL_CAPSULE | Freq: Every morning | ORAL | Status: DC
  Administered 2024-01-30 – 2024-01-31 (×2): 225 mg via ORAL
  Filled 2024-01-29 (×2): qty 1

## 2024-01-29 MED ORDER — IPRATROPIUM-ALBUTEROL 0.5-2.5 (3) MG/3ML IN SOLN
3.0000 mL | RESPIRATORY_TRACT | Status: DC
  Administered 2024-01-29 – 2024-01-30 (×7): 3 mL via RESPIRATORY_TRACT
  Filled 2024-01-29 (×4): qty 3

## 2024-01-29 MED ORDER — MAGNESIUM SULFATE IN D5W 1-5 GM/100ML-% IV SOLN
1.0000 g | Freq: Once | INTRAVENOUS | Status: AC
  Administered 2024-01-29: 1 g via INTRAVENOUS
  Filled 2024-01-29: qty 100

## 2024-01-29 MED ORDER — ALBUTEROL SULFATE HFA 108 (90 BASE) MCG/ACT IN AERS: 2.0000 | INHALATION_SPRAY | RESPIRATORY_TRACT | Status: DC | PRN

## 2024-01-29 MED ORDER — IPRATROPIUM-ALBUTEROL 0.5-2.5 (3) MG/3ML IN SOLN
3.0000 mL | Freq: Once | RESPIRATORY_TRACT | Status: AC
  Administered 2024-01-29: 3 mL via RESPIRATORY_TRACT
  Filled 2024-01-29: qty 3

## 2024-01-29 MED ORDER — ALBUTEROL SULFATE (2.5 MG/3ML) 0.083% IN NEBU
INHALATION_SOLUTION | RESPIRATORY_TRACT | Status: AC
  Filled 2024-01-29: qty 3

## 2024-01-29 MED ORDER — LISDEXAMFETAMINE DIMESYLATE 30 MG PO CAPS
50.0000 mg | ORAL_CAPSULE | Freq: Every day | ORAL | Status: DC
  Administered 2024-01-30: 50 mg via ORAL
  Filled 2024-01-29 (×2): qty 1

## 2024-01-29 MED ORDER — GABAPENTIN 100 MG PO CAPS
200.0000 mg | ORAL_CAPSULE | Freq: Three times a day (TID) | ORAL | Status: DC
  Administered 2024-01-29 – 2024-01-31 (×5): 200 mg via ORAL
  Filled 2024-01-29 (×5): qty 2

## 2024-01-29 MED ORDER — IPRATROPIUM-ALBUTEROL 0.5-2.5 (3) MG/3ML IN SOLN
3.0000 mL | Freq: Once | RESPIRATORY_TRACT | Status: AC
  Administered 2024-01-29: 3 mL via RESPIRATORY_TRACT

## 2024-01-29 MED ORDER — BREXPIPRAZOLE 1 MG PO TABS
2.0000 mg | ORAL_TABLET | Freq: Every morning | ORAL | Status: DC
  Administered 2024-01-30 – 2024-01-31 (×2): 2 mg via ORAL
  Filled 2024-01-29 (×2): qty 2

## 2024-01-29 MED ORDER — PREDNISONE 50 MG PO TABS
60.0000 mg | ORAL_TABLET | Freq: Once | ORAL | Status: AC
  Administered 2024-01-29: 60 mg via ORAL
  Filled 2024-01-29: qty 1

## 2024-01-29 NOTE — ED Provider Notes (Signed)
 [place] [facility] AT [facility] Provider Note   CSN: [id number] Arrival date & time: 01/29/24  1107     Patient presents with: Shortness of Breath   Dana Thompson is a 23 y.o. female.   The history is provided by the patient, a parent and medical records. No language interpreter was used.  Shortness of Breath    23 year old female with history of anxiety, depression, ADHD, thyroid  disease, presenting with complaints of shortness of breath.  Patient states for the past several days she feels a bit weak and tired with flulike symptoms.  This morning she felt congested, having increased wheezing, shortness of breath, and feels as if she is unable to catch her breath.  She states she does vape on a regular basis and is actively trying to quit but states she feels very fearful of quitting vaping as it is a coping mechanism for her.  She noticed that she is using her gabapentin more frequent.  She does not endorse any fever or chills no nausea vomiting or diarrhea.  No new medication changes.  No prior PE or DVT.  No history of asthma.  Prior to Admission medications   Medication Sig Start Date End Date Taking? Authorizing Provider  etonogestrel (NEXPLANON) 68 MG IMPL implant 1 each by Subdermal route once.    [provider]  hydrOXYzine  (VISTARIL ) 25 MG capsule Take 25 mg by mouth 2 (two) times daily.    [provider]  lisdexamfetamine (VYVANSE) 50 MG capsule Take 1 capsule (50 mg total) by mouth daily. 01/28/24     lisdexamfetamine (VYVANSE) 60 MG capsule Take 60 mg by mouth in the morning.    [provider]  REXULTI  2 MG TABS tablet Take 2 mg by mouth every morning.    [provider]  venlafaxine  XR (EFFEXOR -XR) 150 MG 24 hr capsule Take 150 mg by mouth daily with breakfast. 09/01/18   [provider]    Allergies: Patient has no known allergies.    Review of Systems  Respiratory:  Positive for shortness of  breath.   All other systems reviewed and are negative.   Updated Vital Signs BP (!) 142/90 (BP Location: Right Arm)   Pulse (!) 105   Temp 98.2 F (36.8 C) (Oral)   Resp 18   SpO2 95%   Physical Exam Vitals and nursing note reviewed.  Constitutional:      Appearance: She is well-developed.     Comments: Patient is tearful  HENT:     Head: Atraumatic.  Eyes:     Conjunctiva/sclera: Conjunctivae normal.  Cardiovascular:     Rate and Rhythm: Tachycardia present.     Heart sounds: No murmur heard.    No friction rub. No gallop.  Pulmonary:     Effort: Pulmonary effort is normal.     Breath sounds: Wheezing present.  Abdominal:     Palpations: Abdomen is soft.  Musculoskeletal:     Cervical back: Neck supple.  Skin:    Findings: No rash.  Neurological:     Mental Status: She is alert.  Psychiatric:        Mood and Affect: Mood normal.     (all labs ordered are listed, but only abnormal results are displayed) Labs Reviewed  BASIC METABOLIC PANEL WITH GFR - Abnormal; Notable for the following components:      Result Value   CO2 20 (*)    Glucose, Bld 104 (*)    All other  components within normal limits  CBC WITH DIFFERENTIAL/PLATELET - Abnormal; Notable for the following components:   WBC 13.2 (*)    Neutro Abs 9.8 (*)    Monocytes Absolute 1.2 (*)    All other components within normal limits  RESP PANEL BY RT-PCR (RSV, FLU A&B, COVID)  RVPGX2  HCG, SERUM, QUALITATIVE  TSH  CBC WITH DIFFERENTIAL/PLATELET    EKG: EKG Interpretation Date/Time:  Wednesday January 29 2024 14:43:30 EST Ventricular Rate:  110 PR Interval:  131 QRS Duration:  85 QT Interval:  328 QTC Calculation: 444 R Axis:   73  Text Interpretation: Sinus tachycardia Since last tracing of earlier today No significant change was found Confirmed by Ellouise Fine ([id number]) on 01/29/2024 2:47:07 PM  Radiology: DG Chest 2 View Result Date: 01/29/2024 EXAM: 2 VIEW(S) XRAY OF THE CHEST  01/29/2024 11:47:43 AM COMPARISON: Prior studies are available. CLINICAL HISTORY: sob FINDINGS: LUNGS AND PLEURA: No focal pulmonary opacity. No pulmonary edema. No pleural effusion. No pneumothorax. HEART AND MEDIASTINUM: No acute abnormality of the cardiac and mediastinal silhouettes. BONES AND SOFT TISSUES: No acute osseous abnormality. IMPRESSION: 1. No acute cardiopulmonary abnormality. Electronically signed by: Lynwood Seip MD 01/29/2024 12:18 PM EST RP Workstation: [id number]     .Critical Care  Performed by: Nivia Colon, PA-C Authorized by: Nivia Colon, PA-C   Critical care provider statement:    Critical care time (minutes):  30   Critical care was time spent personally by me on the following activities:  Development of treatment plan with patient or surrogate, discussions with consultants, evaluation of patient's response to treatment, examination of patient, ordering and review of laboratory studies, ordering and review of radiographic studies, ordering and performing treatments and interventions, pulse oximetry, re-evaluation of patient's condition and review of old charts    Medications Ordered in the ED  albuterol (PROVENTIL,VENTOLIN) solution continuous neb (10 mg/hr Nebulization Not Given 01/29/24 1257)  albuterol (PROVENTIL) (2.5 MG/3ML) 0.083% nebulizer solution (  Not Given 01/29/24 1303)  albuterol (VENTOLIN HFA) 108 (90 Base) MCG/ACT inhaler 2 puff (has no administration in time range)  magnesium  sulfate IVPB 1 g 100 mL (has no administration in time range)  ipratropium-albuterol (DUONEB) 0.5-2.5 (3) MG/3ML nebulizer solution 3 mL (3 mLs Nebulization Given 01/29/24 1122)  predniSONE (DELTASONE) tablet 60 mg (60 mg Oral Given 01/29/24 1258)  albuterol (PROVENTIL) (2.5 MG/3ML) 0.083% nebulizer solution 10 mg (10 mg Nebulization Given 01/29/24 1257)  sodium chloride  0.9 % bolus 1,000 mL (0 mLs Intravenous Stopped 01/29/24 1610)  ipratropium-albuterol (DUONEB) 0.5-2.5 (3) MG/3ML  nebulizer solution 3 mL (3 mLs Nebulization Given 01/29/24 1617)                                    Medical Decision Making Amount and/or Complexity of Data Reviewed Labs: ordered. Radiology: ordered.  Risk Prescription drug management.   BP (!) 142/90 (BP Location: Right Arm)   Pulse (!) 105   Temp 98.2 F (36.8 C) (Oral)   Resp 18   SpO2 95%   54:23 AM 23 year old female with history of anxiety, depression, ADHD, thyroid  disease, presenting with complaints of shortness of breath.  Patient states for the past several days she feels a bit weak and tired with flulike symptoms.  This morning she felt congested, having increased wheezing, shortness of breath, and feels as if she is unable to catch her breath.  She states she does vape on a  regular basis and is actively trying to quit but states she feels very fearful of quitting vaping as it is a coping mechanism for her.  She noticed that she is using her gabapentin more frequent.  She does not endorse any fever or chills no nausea vomiting or diarrhea.  No new medication changes.  No prior PE or DVT.  No history of asthma.  On exam, patient is tearful with some tachypnea and faint inspiratory and expiratory wheezes heard.  No rales or rhonchi.  Workup initiated.  History of vaping, will obtain chest x-ray.  Patient given DuoNebs.  -Labs ordered, independently viewed and interpreted by me.  Labs remarkable for normal TSH, low suspicion for thyroid  condition, white count is elevated 13.2 likely stress demargination, electrolyte panels are reassuring, pregnancy test is negative -The patient was maintained on a cardiac monitor.  I personally viewed and interpreted the cardiac monitored which showed an underlying rhythm of: Sinus tachycardia -Imaging independently viewed and interpreted by me and I agree with radiologist's interpretation.  Result remarkable for chest x-ray unremarkable -This patient presents to the ED for concern of  shortness of breath, this involves an extensive number of treatment options, and is a complaint that carries with it a high risk of complications and morbidity.  The differential diagnosis includes reactive airway disease, asthma, COPD, viral illness, goiter, URI -Co morbidities that complicate the patient evaluation includes anxiety, depression, regular vape use -Treatment includes prednisone, DuoNeb, albuterol, mag, IVF -Reevaluation of the patient after these medicines showed that the patient improved however she remained tachycardic and when she was there the patient states she felt a bit lightheadedness.  She mention her shortness of breath has improved.  Plan to give patient IV fluid and reassess.  Was considered chest CTA to r/o PE but felt tachycardia maybe 2/2 albuterol and underlying anxiety.   -PCP office notes or outside notes reviewed -Discussion with specialist [facility] DR. Goodridge who agrees to admit pt.  -Escalation to admission/observation considered: patient is agreeable with admission   When I waiting, O2 sats dropped down to 87% briefly and then maintains around 94% while she ambulates.  On reexamination, patient still has wheezes.  Will consult hospital for admission. Suspect reactive airway disease in the setting of heavy vape use.     Final diagnoses:  Acute respiratory failure with hypoxia [facility])    ED Discharge Orders     None          Nivia Colon, PA-C 01/29/24 1722    Ellouise Tyrone K, DO 01/30/24 [phone]

## 2024-01-29 NOTE — ED Notes (Signed)
 Report given to the Floor RN.

## 2024-01-29 NOTE — Progress Notes (Signed)
 PHARMACIST - PHYSICIAN ORDER COMMUNICATION  CONCERNING: P&T Medication Policy on Herbal Medications  DESCRIPTION:  This patient's order for:  L-Methylfolate  has been noted.  This product(s) is classified as an "herbal" or natural product. Due to a lack of definitive safety studies or FDA approval, nonstandard manufacturing practices, plus the potential risk of unknown drug-drug interactions while on inpatient medications, the Pharmacy and Therapeutics Committee does not permit the use of "herbal" or natural products of this type within [facility].   ACTION TAKEN: The pharmacy department is unable to verify this order at this time and your patient has been informed of this safety policy. Please reevaluate patient's clinical condition at discharge and address if the herbal or natural product(s) should be resumed at that time.  Daleyza Gadomski, PharmD

## 2024-01-29 NOTE — ED Notes (Signed)
 Ambulated by [email or web address]

## 2024-01-29 NOTE — Progress Notes (Signed)
 Plan of Care Note for accepted transfer   Patient: Dana Thompson MRN: [id number]   DOA: 01/29/2024  Facility requesting transfer: [facility] Requesting Provider: Lonni Camp, PA Reason for transfer: SOB Facility course:  23 year old woman with no known lung disease presented with shortness of breath and flulike symptoms.  Vapes regularly.  In the emergency department tachycardic, transiently hypoxic, remains short of breath despite standard treatments.  Request for observation for persistent shortness of breath.  Afebrile  Respiratory rate in the 20s  Heart rate 110s  Screening laboratory studies relatively unrevealing  Respiratory panel negative  Chest x-ray no acute disease  Plan of care: The patient is accepted for admission to Med-surg  unit, at [facility]..  Patient remains under the care of EDP until arrived at [facility].  Author: Toribio Door, MD 01/29/2024  Check [email or web address] for on-call coverage.  Nursing staff, Please call [facility] Admits & Consults System-Wide number on Amion as soon as patient's arrival, so appropriate admitting provider can evaluate the pt.

## 2024-01-29 NOTE — ED Notes (Signed)
 Performed walking oxygen test per EDP. Patient initially was sating at 88% for approximately 15-20 seconds, but quickly increased to 94%. Patient stayed above 90% for the rest of the trial. Patient did report feeling lightheaded.

## 2024-01-29 NOTE — ED Notes (Signed)
EKG performed by Triage

## 2024-01-29 NOTE — Progress Notes (Signed)
 Neb tx given due to SOB and wheezing.

## 2024-01-29 NOTE — ED Notes (Signed)
 [facility] called to transport the patient to [facility] rm# [phone]

## 2024-01-29 NOTE — ED Triage Notes (Addendum)
 Patient reports shortness of breath since this morning. States flu like symptoms this week as well. States has been trying to quite vaping. 92% on room air int triage. Respiratory at bedside to assess patient.

## 2024-01-29 NOTE — H&P (Incomplete)
 History and Physical    Dana Thompson FMW:[id number] DOB: Apr 19, 2000 DOA: 01/29/2024  Patient coming from: Home.  Chief Complaint: Shortness of breath.  HPI: Dana Thompson is a 23 y.o. female with history of depression, ADHD, prediabetes presents to the ER with complaints of shortness of breath.  Patient states that since last evening patient was getting short of breath with wheezing which acutely worsened this morning.  Denies chest pain.  Has been having some nonproductive cough.  Denies any recent travel or sick contacts.  ED Course: In the ER patient had chest x-ray which was unremarkable.  Labs showed WBC of 13.2 COVID and flu test were negative.  On exam patient was wheezing and was placed on nebulizer and prednisone and was requiring three liters of oxygen admitted for further observation.  At the time of my exam patient is not in distress.  On exam patient still wheezing and on 3 L oxygen.  Review of Systems: As per HPI, rest all negative.   Past Medical History:  Diagnosis Date   ADHD (attention deficit hyperactivity disorder)    Anxiety    Attention deficit disorder    Depression    Irregular heartbeat    Premature Atrial Contractions/ Palpitations   Pre-diabetes    Urinary tract infection    in past, but not currently    History reviewed. No pertinent surgical history.   reports that she has never smoked. She has never used smokeless tobacco. She reports current alcohol use of about 1.0 standard drink of alcohol per week. She reports current drug use. Frequency: 7.00 times per week. Drug: Marijuana.  No Known Allergies  Family History  Problem Relation Age of Onset   Cancer Maternal Grandmother    Cancer Paternal Grandmother    Hyperlipidemia Father    Hypertension Father     Prior to Admission medications   Medication Sig Start Date End Date Taking? Authorizing Provider  gabapentin (NEURONTIN) 100 MG capsule Take 200 mg by mouth 3 (three) times daily.  12/03/23  Yes [provider]  L-Methylfolate 7.5 MG TABS Take 2 tablets by mouth daily. 12/05/23  Yes [provider]  etonogestrel (NEXPLANON) 68 MG IMPL implant 1 each by Subdermal route once.    [provider]  hydrOXYzine  (VISTARIL ) 25 MG capsule Take 25 mg by mouth 2 (two) times daily.    [provider]  lisdexamfetamine (VYVANSE) 50 MG capsule Take 1 capsule (50 mg total) by mouth daily. 01/28/24     lisdexamfetamine (VYVANSE) 60 MG capsule Take 60 mg by mouth in the morning.    [provider]  REXULTI  2 MG TABS tablet Take 2 mg by mouth every morning.    [provider]  SPRAVATO, 84 MG DOSE, 28 MG/DEVICE SOPK Place into both nostrils.    [provider]  venlafaxine  XR (EFFEXOR -XR) 150 MG 24 hr capsule Take 150 mg by mouth daily with breakfast. 09/01/18   [provider]  venlafaxine  XR (EFFEXOR -XR) 75 MG 24 hr capsule Take 225 mg by mouth every morning.    [provider]    Physical Exam: Constitutional: Moderately built and nourished. Vitals:   01/29/24 1730 01/29/24 1800 01/29/24 1900 01/29/24 2012  BP: 118/64 123/72 135/84   Pulse: (!) 105 97 91   Resp:  (!) 22  (!) 22  Temp:   98.3 F (36.8 C)   TempSrc:   Oral   SpO2: 93% 92% 96%   Weight:  107.9 kg  Height:    5' 6 (1.676 m)   Eyes: Anicteric no pallor. ENMT: No discharge from the ears eyes nose or mouth. Neck: No mass felt.  No neck rigidity. Respiratory: Bilateral expiratory wheeze and no crepitations. Cardiovascular: S1-S2 heard. Abdomen: Soft nontender bowel sound present. Musculoskeletal: No edema. Skin: No rash. Neurologic: Alert awake oriented time place and person.  Moves all extremities. Psychiatric: Appears normal.  Normal affect.   Labs on Admission: I have personally reviewed following labs and imaging studies  CBC: Recent Labs  Lab 01/29/24 1152  WBC 13.2*  NEUTROABS 9.8*  HGB 14.8  HCT 44.0  MCV 88.2  PLT  306   Basic Metabolic Panel: Recent Labs  Lab 01/29/24 1152  NA 137  K 4.0  CL 103  CO2 20*  GLUCOSE 104*  BUN 7  CREATININE 0.79  CALCIUM 10.0   GFR: Estimated Creatinine Clearance: 135.9 mL/min (by C-G formula based on SCr of 0.79 mg/dL). Liver Function Tests: No results for input(s): AST, ALT, ALKPHOS, BILITOT, PROT, ALBUMIN in the last 168 hours. No results for input(s): LIPASE, AMYLASE in the last 168 hours. No results for input(s): AMMONIA in the last 168 hours. Coagulation Profile: No results for input(s): INR, PROTIME in the last 168 hours. Cardiac Enzymes: No results for input(s): CKTOTAL, CKMB, CKMBINDEX, TROPONINI in the last 168 hours. BNP (last 3 results) No results for input(s): PROBNP in the last 8760 hours. HbA1C: No results for input(s): HGBA1C in the last 72 hours. CBG: No results for input(s): GLUCAP in the last 168 hours. Lipid Profile: No results for input(s): CHOL, HDL, LDLCALC, TRIG, CHOLHDL, LDLDIRECT in the last 72 hours. Thyroid  Function Tests: Recent Labs    01/29/24 1200  TSH 1.020   Anemia Panel: No results for input(s): VITAMINB12, FOLATE, FERRITIN, TIBC, IRON, RETICCTPCT in the last 72 hours. Urine analysis:    Component Value Date/Time   COLORURINE YELLOW 03/08/2022 1939   APPEARANCEUR CLEAR 03/08/2022 1939   LABSPEC 1.015 03/08/2022 1939   PHURINE 5.0 03/08/2022 1939   GLUCOSEU NEGATIVE 03/08/2022 1939   HGBUR SMALL (A) 03/08/2022 1939   BILIRUBINUR NEGATIVE 03/08/2022 1939   KETONESUR NEGATIVE 03/08/2022 1939   PROTEINUR NEGATIVE 03/08/2022 1939   UROBILINOGEN 0.2 09/01/2007 1440   NITRITE NEGATIVE 03/08/2022 1939   LEUKOCYTESUR NEGATIVE 03/08/2022 1939   Sepsis Labs: @LABRCNTIP (procalcitonin:4,lacticidven:4) ) Recent Results (from the past 240 hours)  Resp panel by RT-PCR (RSV, Flu A&B, Covid) Anterior Nasal Swab     Status: None   Collection Time: 01/29/24  11:16 AM   Specimen: Anterior Nasal Swab  Result Value Ref Range Status   SARS Coronavirus 2 by RT PCR NEGATIVE NEGATIVE Final    Comment: (NOTE) SARS-CoV-2 target nucleic acids are NOT DETECTED.  The SARS-CoV-2 RNA is generally detectable in upper respiratory specimens during the acute phase of infection. The lowest concentration of SARS-CoV-2 viral copies this assay can detect is 138 copies/mL. A negative result does not preclude SARS-Cov-2 infection and should not be used as the sole basis for treatment or other patient management decisions. A negative result may occur with  improper specimen collection/handling, submission of specimen other than nasopharyngeal swab, presence of viral mutation(s) within the areas targeted by this assay, and inadequate number of viral copies(<138 copies/mL). A negative result must be combined with clinical observations, patient history, and epidemiological information. The expected result is Negative.  Fact Sheet for Patients:  bloggercourse.com  Fact Sheet for Healthcare Providers:  seriousbroker.it  This test is no t yet approved or cleared by the [place]  FDA and  has been authorized for detection and/or diagnosis of SARS-CoV-2 by FDA under an Emergency Use Authorization (EUA). This EUA will remain  in effect (meaning this test can be used) for the duration of the COVID-19 declaration under Section 564(b)(1) of the Act, 21 U.S.C.section 360bbb-3(b)(1), unless the authorization is terminated  or revoked sooner.       Influenza A by PCR NEGATIVE NEGATIVE Final   Influenza B by PCR NEGATIVE NEGATIVE Final    Comment: (NOTE) The Xpert Xpress SARS-CoV-2/FLU/RSV plus assay is intended as an aid in the diagnosis of influenza from Nasopharyngeal swab specimens and should not be used as a sole basis for treatment. Nasal washings and aspirates are unacceptable for Xpert Xpress  SARS-CoV-2/FLU/RSV testing.  Fact Sheet for Patients: bloggercourse.com  Fact Sheet for Healthcare Providers: seriousbroker.it  This test is not yet approved or cleared by the [place]  FDA and has been authorized for detection and/or diagnosis of SARS-CoV-2 by FDA under an Emergency Use Authorization (EUA). This EUA will remain in effect (meaning this test can be used) for the duration of the COVID-19 declaration under Section 564(b)(1) of the Act, 21 U.S.C. section 360bbb-3(b)(1), unless the authorization is terminated or revoked.     Resp Syncytial Virus by PCR NEGATIVE NEGATIVE Final    Comment: (NOTE) Fact Sheet for Patients: bloggercourse.com  Fact Sheet for Healthcare Providers: seriousbroker.it  This test is not yet approved or cleared by the [place]  FDA and has been authorized for detection and/or diagnosis of SARS-CoV-2 by FDA under an Emergency Use Authorization (EUA). This EUA will remain in effect (meaning this test can be used) for the duration of the COVID-19 declaration under Section 564(b)(1) of the Act, 21 U.S.C. section 360bbb-3(b)(1), unless the authorization is terminated or revoked.  Performed at [facility], [address], [place], [place] [address]      Radiological Exams on Admission: DG Chest 2 View Result Date: 01/29/2024 EXAM: 2 VIEW(S) XRAY OF THE CHEST 01/29/2024 11:47:43 AM COMPARISON: Prior studies are available. CLINICAL HISTORY: sob FINDINGS: LUNGS AND PLEURA: No focal pulmonary opacity. No pulmonary edema. No pleural effusion. No pneumothorax. HEART AND MEDIASTINUM: No acute abnormality of the cardiac and mediastinal silhouettes. BONES AND SOFT TISSUES: No acute osseous abnormality. IMPRESSION: 1. No acute cardiopulmonary abnormality. Electronically signed by: Lynwood Seip MD 01/29/2024 12:18 PM EST RP Workstation:  [id number]    EKG: Independently reviewed.  Sinus tachycardia.  Assessment/Plan Principal Problem:   Acute respiratory failure with hypoxia (HCC) Active Problems:   MDD (major depressive disorder), recurrent severe, without psychosis (HCC)   SOB (shortness of breath)   Acute bronchitis    Acute respiratory failure with hypoxia presently on 3 L oxygen on exam patient is diffusely wheezing likely from acute bronchitis.  Will check respiratory viral panel.  COVID and flu test were negative.  Will keep patient on scheduled and as needed nebulizers along with steroids.  D-dimer was negative. History of depression and anxiety on Effexor , Rexulti  gabapentin and Spravato. ADHD on Vyvanse. Leukocytosis likely reactionary.  Will continue to observe. Elevated blood pressure reading.  Follow blood pressure trends. Obesity with BMI of 38.  Will need nutrition counseling.  Since patient has acute respiratory failure with hypoxia likely from bronchitis presently on 3 L oxygen still wheezing will need close monitoring and more than 2 midnight stay.   DVT prophylaxis: Lovenox. Code Status: Full code. Family Communication:  Patient's parents at the bedside. Disposition Plan: Medical floor. Consults called: None. Admission status: Observation.

## 2024-01-30 DIAGNOSIS — E669 Obesity, unspecified: Secondary | ICD-10-CM | POA: Insufficient documentation

## 2024-01-30 DIAGNOSIS — F332 Major depressive disorder, recurrent severe without psychotic features: Secondary | ICD-10-CM | POA: Diagnosis present

## 2024-01-30 DIAGNOSIS — R000 Tachycardia, unspecified: Secondary | ICD-10-CM | POA: Diagnosis present

## 2024-01-30 DIAGNOSIS — J209 Acute bronchitis, unspecified: Secondary | ICD-10-CM | POA: Diagnosis present

## 2024-01-30 DIAGNOSIS — R030 Elevated blood-pressure reading, without diagnosis of hypertension: Secondary | ICD-10-CM | POA: Diagnosis present

## 2024-01-30 DIAGNOSIS — Z6838 Body mass index (BMI) 38.0-38.9, adult: Secondary | ICD-10-CM | POA: Diagnosis not present

## 2024-01-30 DIAGNOSIS — D72829 Elevated white blood cell count, unspecified: Secondary | ICD-10-CM | POA: Insufficient documentation

## 2024-01-30 DIAGNOSIS — F419 Anxiety disorder, unspecified: Secondary | ICD-10-CM | POA: Diagnosis present

## 2024-01-30 DIAGNOSIS — R0602 Shortness of breath: Secondary | ICD-10-CM | POA: Diagnosis present

## 2024-01-30 DIAGNOSIS — Z1152 Encounter for screening for COVID-19: Secondary | ICD-10-CM | POA: Diagnosis not present

## 2024-01-30 DIAGNOSIS — Z8249 Family history of ischemic heart disease and other diseases of the circulatory system: Secondary | ICD-10-CM | POA: Diagnosis not present

## 2024-01-30 DIAGNOSIS — F909 Attention-deficit hyperactivity disorder, unspecified type: Secondary | ICD-10-CM | POA: Diagnosis present

## 2024-01-30 DIAGNOSIS — F17290 Nicotine dependence, other tobacco product, uncomplicated: Secondary | ICD-10-CM | POA: Diagnosis present

## 2024-01-30 DIAGNOSIS — J9601 Acute respiratory failure with hypoxia: Secondary | ICD-10-CM | POA: Diagnosis present

## 2024-01-30 DIAGNOSIS — Z79899 Other long term (current) drug therapy: Secondary | ICD-10-CM | POA: Diagnosis not present

## 2024-01-30 LAB — RESPIRATORY PANEL BY PCR

## 2024-01-30 LAB — CBC
HCT: 41.9 % (ref 36.0–46.0)
Hemoglobin: 13.6 g/dL (ref 12.0–15.0)
MCH: 29.8 pg (ref 26.0–34.0)
MCHC: 32.5 g/dL (ref 30.0–36.0)
MCV: 91.7 fL (ref 80.0–100.0)
Platelets: 310 K/uL (ref 150–400)
RBC: 4.57 MIL/uL (ref 3.87–5.11)
RDW: 13.7 % (ref 11.5–15.5)
WBC: 13.8 K/uL — ABNORMAL HIGH (ref 4.0–10.5)
nRBC: 0 % (ref 0.0–0.2)

## 2024-01-30 LAB — BASIC METABOLIC PANEL WITH GFR
Anion gap: 13 (ref 5–15)
BUN: 10 mg/dL (ref 6–20)
CO2: 20 mmol/L — ABNORMAL LOW (ref 22–32)
Calcium: 9.4 mg/dL (ref 8.9–10.3)
Chloride: 105 mmol/L (ref 98–111)
Creatinine, Ser: 0.73 mg/dL (ref 0.44–1.00)
GFR, Estimated: >60 mL/min (ref >60)
Glucose, Bld: 105 mg/dL — ABNORMAL HIGH (ref 70–99)
Potassium: 4 mmol/L (ref 3.5–5.1)
Sodium: 137 mmol/L (ref 135–145)

## 2024-01-30 LAB — HIV ANTIBODY (ROUTINE TESTING W REFLEX): HIV Screen 4th Generation wRfx: NONREACTIVE

## 2024-01-30 MED ORDER — ARFORMOTEROL TARTRATE 15 MCG/2ML IN NEBU
15.0000 ug | INHALATION_SOLUTION | Freq: Two times a day (BID) | RESPIRATORY_TRACT | Status: DC
  Administered 2024-01-30 – 2024-01-31 (×3): 15 ug via RESPIRATORY_TRACT
  Filled 2024-01-30 (×3): qty 2

## 2024-01-30 MED ORDER — AZITHROMYCIN 250 MG PO TABS
500.0000 mg | ORAL_TABLET | Freq: Every day | ORAL | Status: DC
  Administered 2024-01-30 – 2024-01-31 (×2): 500 mg via ORAL
  Filled 2024-01-30 (×2): qty 2

## 2024-01-30 MED ORDER — ENOXAPARIN SODIUM 60 MG/0.6ML IJ SOSY
50.0000 mg | PREFILLED_SYRINGE | INTRAMUSCULAR | Status: DC
  Administered 2024-01-30: 50 mg via SUBCUTANEOUS
  Filled 2024-01-30: qty 0.6

## 2024-01-30 MED ORDER — AMOXICILLIN-POT CLAVULANATE 875-125 MG PO TABS
1.0000 | ORAL_TABLET | Freq: Two times a day (BID) | ORAL | Status: DC
  Administered 2024-01-30 – 2024-01-31 (×3): 1 via ORAL
  Filled 2024-01-30 (×3): qty 1

## 2024-01-30 MED ORDER — MELATONIN 5 MG PO TABS
5.0000 mg | ORAL_TABLET | Freq: Every evening | ORAL | Status: DC | PRN
  Administered 2024-01-31: 5 mg via ORAL
  Filled 2024-01-30: qty 1

## 2024-01-30 MED ORDER — BUDESONIDE 0.25 MG/2ML IN SUSP
0.2500 mg | Freq: Two times a day (BID) | RESPIRATORY_TRACT | Status: DC
  Administered 2024-01-30 – 2024-01-31 (×3): 0.25 mg via RESPIRATORY_TRACT
  Filled 2024-01-30 (×3): qty 2

## 2024-01-30 MED ORDER — GUAIFENESIN ER 600 MG PO TB12
600.0000 mg | ORAL_TABLET | Freq: Two times a day (BID) | ORAL | Status: DC
  Administered 2024-01-30 – 2024-01-31 (×3): 600 mg via ORAL
  Filled 2024-01-30 (×3): qty 1

## 2024-01-30 NOTE — Plan of Care (Signed)

## 2024-01-30 NOTE — TOC Initial Note (Signed)
 Transition of Care [facility] - Initial/Assessment Note    Patient Details  Name: Dana Thompson MRN: [id number] Date of Birth: 2000-09-16  Transition of Care [facility]) CM/SW Contact:    Doneta Glenys DASEN, RN Phone Number: 01/30/2024, 10:13 AM  Clinical Narrative:                 CM introduced and explained role. PTA lives in a town house with Alberson,Alex (Father)[phone]; verified PCP/insurance;denies DME, HH, oxygen, and SDOH risk;one of the parents will transport home at discharge. Inpatient care management will follow for oxygen need.  Expected Discharge Plan: Home/Self Care Barriers to Discharge: No Barriers Identified   Patient Goals and CMS Choice Patient states their goals for this hospitalization and ongoing recovery are:: Home CMS Medicare.gov Compare Post Acute Care list provided to:: Patient Choice offered to / list presented to : Patient [place] ownership interest in [facility].provided to:: Patient    Expected Discharge Plan and Services In-house Referral: NA Discharge Planning Services: CM Consult Post Acute Care Choice: NA Living arrangements for the past 2 months: Single Family Home                 DME Arranged: N/A DME Agency: NA       HH Arranged: NA HH Agency: NA        Prior Living Arrangements/Services Living arrangements for the past 2 months: Single Family Home Lives with:: Parents (Father) Patient language and need for interpreter reviewed:: Yes Do you feel safe going back to the place where you live?: Yes      Need for Family Participation in Patient Care: No (Comment) Care giver support system in place?: Yes (comment) Current home services:  (NA) Criminal Activity/Legal Involvement Pertinent to Current Situation/Hospitalization: No - Comment as needed  Activities of Daily Living   ADL Screening (condition at time of admission) Independently performs ADLs?: Yes (appropriate for developmental age) Is the patient deaf or have  difficulty hearing?: Yes Does the patient have difficulty seeing, even when wearing glasses/contacts?: Yes Does the patient have difficulty concentrating, remembering, or making decisions?: Yes (ADHD)  Permission Sought/Granted Permission sought to share information with : Case Manager Permission granted to share information with : Yes, Verbal Permission Granted  Share Information with NAME: Setsuko, Robins (Father)  [phone], Barnie (mother) [phone], Case Management           Emotional Assessment Appearance:: Appears stated age Attitude/Demeanor/Rapport: Engaged Affect (typically observed): Appropriate Orientation: : Oriented to Self, Oriented to Place, Oriented to  Time, Oriented to Situation Alcohol / Substance Use: Not Applicable Psych Involvement: No (comment)  Admission diagnosis:  SOB (shortness of breath) [R06.02] Acute respiratory failure with hypoxia (HCC) [J96.01] Patient Active Problem List   Diagnosis Date Noted   Elevated blood pressure reading 01/30/2024   Leukocytosis 01/30/2024   Obesity (BMI 30-39.9) 01/30/2024   SOB (shortness of breath) 01/29/2024   Acute respiratory failure with hypoxia (HCC) 01/29/2024   Acute bronchitis 01/29/2024   Severe recurrent major depression (HCC) 04/28/2020   MDD (major depressive disorder), recurrent episode, severe (HCC) 04/29/2017   Suicide ideation    MDD (major depressive disorder), recurrent severe, without psychosis (HCC) 02/02/2017   Postprandial abdominal bloating 07/27/2016   Thyroiditis, autoimmune 01/10/2015   Acquired acanthosis nigricans 01/10/2015   Palpitations 01/10/2015   Prediabetes 06/07/2014   Childhood overweight, BMI 85-94.9 percentile 06/07/2014   Goiter 06/07/2014   PCP:  [facility] Pharmacy:   [id number] [address] [place], [place] -  [address] [place] [address] Phone: [phone] Fax: [phone]     Social Drivers of Health  (SDOH) Social History: SDOH Screenings   Food Insecurity: No Food Insecurity (01/29/2024)  Housing: Low Risk  (01/29/2024)  Transportation Needs: No Transportation Needs (01/29/2024)  Utilities: Not At Risk (01/29/2024)  Alcohol Screen: Low Risk  (04/29/2017)  Social Connections: Moderately Isolated (01/29/2024)  Tobacco Use: Low Risk  (01/29/2024)   SDOH Interventions:     Readmission Risk Interventions     No data to display

## 2024-01-30 NOTE — Progress Notes (Signed)
 PROGRESS NOTE    [name] Azzarello  FMW:[id number] DOB: 2000-10-23 DOA: 01/29/2024 PCP: [facility]   Brief Narrative: 23 year old with past medical history significant for depression, ADHD, prediabetes presented to the ED complaining of shortness of breath, cough and wheezing.  She reported nonproductive cough.  No sick contact.  Chest x-ray was unremarkable.  White blood cell 13, COVID and flu test negative.  Patient was noted to be wheezing, placed on nebulizer and steroids.  Patient required 3 L of oxygen on admission.   Assessment & Plan:   Principal Problem:   Acute respiratory failure with hypoxia (HCC) Active Problems:   MDD (major depressive disorder), recurrent severe, without psychosis (HCC)   SOB (shortness of breath)   Acute bronchitis   Elevated blood pressure reading   Leukocytosis   Obesity (BMI 30-39.9)   1-Acute hypoxic respiratory failure, in the setting of Acute Bronchitis: Suspect related to viral illness. - Continue IV steroid, DuoNebs.  Will add Brovana and Pulmicort.  Restart guaifenesin. -Covid, Influenza panel negative.  Respiratory panel pending. - She does have leukocytosis, will cover with antibiotics - Wean off oxygen as tolerated. - Recommend patient to get pulmonary function test as an outpatient to evaluate for late onset asthma.  ADHD: Continue Vyvanse Depression: Continue with Effexor  gabapentin Leukocytosis: In the setting of #1 started on antibiotics  Obesity, BMI 38: Need lifestyle modification  Estimated body mass index is 38.39 kg/m as calculated from the following:   Height as of this encounter: 5' 6 (1.676 m).   Weight as of this encounter: 107.9 kg.   DVT prophylaxis: Lovenox Code Status: Full code Family Communication: Care discussed with patient, mother was at bedside at the time of conversation Disposition Plan:  Status is: Observation The patient remains OBS appropriate and will d/c before 2  midnights.    Consultants:  none Procedures:  none  Antimicrobials:    Subjective: She report nonproductive cough, shortness of breath, denies any history of asthma.    Objective: Vitals:   01/29/24 2125 01/29/24 2126 01/30/24 0704 01/30/24 0743  BP: (!) 153/94 (!) 149/82 (!) 141/66   Pulse: 97 91 68   Resp: 18  18   Temp: 98.2 F (36.8 C)  98.7 F (37.1 C)   TempSrc:      SpO2: 95%  97% 97%  Weight:      Height:        Intake/Output Summary (Last 24 hours) at 01/30/2024 0955 Last data filed at 01/29/2024 1816 Gross per 24 hour  Intake 1100 ml  Output --  Net 1100 ml   Filed Weights   01/29/24 2012  Weight: 107.9 kg    Examination:  General exam: Appears calm and comfortable  Respiratory system: Bilateral wheezing and rhonchorous respiratory effort normal. Cardiovascular system: S1 & S2 heard, RRR. No JVD, murmurs, rubs, gallops or clicks. No pedal edema. Gastrointestinal system: Abdomen is nondistended, soft and nontender. No organomegaly or masses felt. Normal bowel sounds heard. Central nervous system: Alert and oriented. No focal neurological deficits. Extremities: Symmetric 5 x 5 power.     Data Reviewed: I have personally reviewed following labs and imaging studies  CBC: Recent Labs  Lab 01/29/24 1152 01/29/24 2047 01/30/24 0500  WBC 13.2* 12.3* 13.8*  NEUTROABS 9.8*  --   --   HGB 14.8 14.0 13.6  HCT 44.0 44.6 41.9  MCV 88.2 91.8 91.7  PLT 306 327 310   Basic Metabolic Panel: Recent Labs  Lab  01/29/24 1152 01/29/24 2047 01/30/24 0500  NA 137  --  137  K 4.0  --  4.0  CL 103  --  105  CO2 20*  --  20*  GLUCOSE 104*  --  105*  BUN 7  --  10  CREATININE 0.79 0.81 0.73  CALCIUM 10.0  --  9.4   GFR: Estimated Creatinine Clearance: 135.9 mL/min (by C-G formula based on SCr of 0.73 mg/dL). Liver Function Tests: No results for input(s): AST, ALT, ALKPHOS, BILITOT, PROT, ALBUMIN in the last 168 hours. No results for  input(s): LIPASE, AMYLASE in the last 168 hours. No results for input(s): AMMONIA in the last 168 hours. Coagulation Profile: No results for input(s): INR, PROTIME in the last 168 hours. Cardiac Enzymes: No results for input(s): CKTOTAL, CKMB, CKMBINDEX, TROPONINI in the last 168 hours. BNP (last 3 results) No results for input(s): PROBNP in the last 8760 hours. HbA1C: No results for input(s): HGBA1C in the last 72 hours. CBG: No results for input(s): GLUCAP in the last 168 hours. Lipid Profile: No results for input(s): CHOL, HDL, LDLCALC, TRIG, CHOLHDL, LDLDIRECT in the last 72 hours. Thyroid  Function Tests: Recent Labs    01/29/24 1200  TSH 1.020   Anemia Panel: No results for input(s): VITAMINB12, FOLATE, FERRITIN, TIBC, IRON, RETICCTPCT in the last 72 hours. Sepsis Labs: No results for input(s): PROCALCITON, LATICACIDVEN in the last 168 hours.  Recent Results (from the past 240 hours)  Resp panel by RT-PCR (RSV, Flu A&B, Covid) Anterior Nasal Swab     Status: None   Collection Time: 01/29/24 11:16 AM   Specimen: Anterior Nasal Swab  Result Value Ref Range Status   SARS Coronavirus 2 by RT PCR NEGATIVE NEGATIVE Final    Comment: (NOTE) SARS-CoV-2 target nucleic acids are NOT DETECTED.  The SARS-CoV-2 RNA is generally detectable in upper respiratory specimens during the acute phase of infection. The lowest concentration of SARS-CoV-2 viral copies this assay can detect is 138 copies/mL. A negative result does not preclude SARS-Cov-2 infection and should not be used as the sole basis for treatment or other patient management decisions. A negative result may occur with  improper specimen collection/handling, submission of specimen other than nasopharyngeal swab, presence of viral mutation(s) within the areas targeted by this assay, and inadequate number of viral copies(<138 copies/mL). A negative result must be combined  with clinical observations, patient history, and epidemiological information. The expected result is Negative.  Fact Sheet for Patients:  bloggercourse.com  Fact Sheet for Healthcare Providers:  seriousbroker.it  This test is no t yet approved or cleared by the [place]  FDA and  has been authorized for detection and/or diagnosis of SARS-CoV-2 by FDA under an Emergency Use Authorization (EUA). This EUA will remain  in effect (meaning this test can be used) for the duration of the COVID-19 declaration under Section 564(b)(1) of the Act, 21 U.S.C.section 360bbb-3(b)(1), unless the authorization is terminated  or revoked sooner.       Influenza A by PCR NEGATIVE NEGATIVE Final   Influenza B by PCR NEGATIVE NEGATIVE Final    Comment: (NOTE) The Xpert Xpress SARS-CoV-2/FLU/RSV plus assay is intended as an aid in the diagnosis of influenza from Nasopharyngeal swab specimens and should not be used as a sole basis for treatment. Nasal washings and aspirates are unacceptable for Xpert Xpress SARS-CoV-2/FLU/RSV testing.  Fact Sheet for Patients: bloggercourse.com  Fact Sheet for Healthcare Providers: seriousbroker.it  This test is not yet approved or cleared by  the [place]  FDA and has been authorized for detection and/or diagnosis of SARS-CoV-2 by FDA under an Emergency Use Authorization (EUA). This EUA will remain in effect (meaning this test can be used) for the duration of the COVID-19 declaration under Section 564(b)(1) of the Act, 21 U.S.C. section 360bbb-3(b)(1), unless the authorization is terminated or revoked.     Resp Syncytial Virus by PCR NEGATIVE NEGATIVE Final    Comment: (NOTE) Fact Sheet for Patients: bloggercourse.com  Fact Sheet for Healthcare Providers: seriousbroker.it  This test is not yet approved  or cleared by the [place]  FDA and has been authorized for detection and/or diagnosis of SARS-CoV-2 by FDA under an Emergency Use Authorization (EUA). This EUA will remain in effect (meaning this test can be used) for the duration of the COVID-19 declaration under Section 564(b)(1) of the Act, 21 U.S.C. section 360bbb-3(b)(1), unless the authorization is terminated or revoked.  Performed at [facility], [address], [place], [place] [address]          Radiology Studies: DG Chest 2 View Result Date: 01/29/2024 EXAM: 2 VIEW(S) XRAY OF THE CHEST 01/29/2024 11:47:43 AM COMPARISON: Prior studies are available. CLINICAL HISTORY: sob FINDINGS: LUNGS AND PLEURA: No focal pulmonary opacity. No pulmonary edema. No pleural effusion. No pneumothorax. HEART AND MEDIASTINUM: No acute abnormality of the cardiac and mediastinal silhouettes. BONES AND SOFT TISSUES: No acute osseous abnormality. IMPRESSION: 1. No acute cardiopulmonary abnormality. Electronically signed by: Lynwood Seip MD 01/29/2024 12:18 PM EST RP Workstation: [id number]        Scheduled Meds:  brexpiprazole   2 mg Oral q morning   enoxaparin (LOVENOX) injection  40 mg Subcutaneous Q24H   gabapentin  200 mg Oral TID   ipratropium-albuterol  3 mL Nebulization Q4H   lisdexamfetamine  50 mg Oral Daily   methylPREDNISolone (SOLU-MEDROL) injection  40 mg Intravenous Q12H   venlafaxine  XR  225 mg Oral q morning   Continuous Infusions:   LOS: 0 days    Time spent: 35  Minutes    Chandi Nicklin A Seferina Brokaw, MD [facility]   If 7PM-7AM, please contact night-coverage [email or web address]  01/30/2024, 9:55 AM

## 2024-01-30 NOTE — Plan of Care (Signed)

## 2024-01-31 DIAGNOSIS — J9601 Acute respiratory failure with hypoxia: Secondary | ICD-10-CM | POA: Diagnosis not present

## 2024-01-31 MED ORDER — ALBUTEROL SULFATE (2.5 MG/3ML) 0.083% IN NEBU: 3.0000 mL | INHALATION_SOLUTION | Freq: Four times a day (QID) | RESPIRATORY_TRACT | Status: DC | PRN

## 2024-01-31 MED ORDER — AZITHROMYCIN 500 MG PO TABS: 500.0000 mg | ORAL_TABLET | Freq: Every day | ORAL | 0 refills | Status: AC

## 2024-01-31 MED ORDER — PREDNISONE 10 MG PO TABS: 40.0000 mg | ORAL_TABLET | Freq: Every day | ORAL | 0 refills | Status: AC

## 2024-01-31 MED ORDER — ALBUTEROL SULFATE HFA 108 (90 BASE) MCG/ACT IN AERS: 2.0000 | INHALATION_SPRAY | Freq: Four times a day (QID) | RESPIRATORY_TRACT | 2 refills | Status: AC | PRN

## 2024-01-31 MED ORDER — IPRATROPIUM-ALBUTEROL 0.5-2.5 (3) MG/3ML IN SOLN: 3.0000 mL | RESPIRATORY_TRACT | Status: DC

## 2024-01-31 MED ORDER — GUAIFENESIN ER 600 MG PO TB12: 600.0000 mg | ORAL_TABLET | Freq: Two times a day (BID) | ORAL | 0 refills | Status: AC

## 2024-01-31 MED ORDER — ALBUTEROL SULFATE HFA 108 (90 BASE) MCG/ACT IN AERS
1.0000 | INHALATION_SPRAY | Freq: Four times a day (QID) | RESPIRATORY_TRACT | Status: DC | PRN
  Administered 2024-01-31: 1 via RESPIRATORY_TRACT
  Filled 2024-01-31: qty 6.7

## 2024-01-31 MED ORDER — FLUTICASONE-SALMETEROL 45-21 MCG/ACT IN AERO: 2.0000 | INHALATION_SPRAY | Freq: Two times a day (BID) | RESPIRATORY_TRACT | 12 refills | Status: AC

## 2024-01-31 MED ORDER — IPRATROPIUM BROMIDE HFA 17 MCG/ACT IN AERS: 1.0000 | INHALATION_SPRAY | Freq: Three times a day (TID) | RESPIRATORY_TRACT | 2 refills | Status: AC

## 2024-01-31 MED ORDER — IPRATROPIUM-ALBUTEROL 0.5-2.5 (3) MG/3ML IN SOLN: 3.0000 mL | Freq: Two times a day (BID) | RESPIRATORY_TRACT | Status: DC

## 2024-01-31 MED ORDER — IPRATROPIUM-ALBUTEROL 0.5-2.5 (3) MG/3ML IN SOLN
3.0000 mL | RESPIRATORY_TRACT | Status: DC
  Administered 2024-01-31: 3 mL via RESPIRATORY_TRACT
  Filled 2024-01-31: qty 3

## 2024-01-31 MED ORDER — AMOXICILLIN-POT CLAVULANATE 875-125 MG PO TABS: 1.0000 | ORAL_TABLET | Freq: Two times a day (BID) | ORAL | 0 refills | Status: AC

## 2024-01-31 NOTE — Plan of Care (Signed)

## 2024-01-31 NOTE — Progress Notes (Signed)
 Patient discharged to home. DC instructions given with mother at bedside. Verbalized no concerns. Patient and mother [facility] that were e-prescribed by Provider. Both verbalized understanding. Patient left unit in wheelchair pushed by NT and accompanied by mother. Left in stable condition.

## 2024-01-31 NOTE — Discharge Summary (Signed)
 Physician Discharge Summary   Patient: Dana Thompson MRN: [id number] DOB: Apr 09, 2000  Admit date:     01/29/2024  Discharge date: 01/31/24  Discharge Physician: Dana Thompson   PCP: [facility]   Recommendations at discharge:   Follow up with PCP for resolution of Bronchitis.   Discharge Diagnoses: Principal Problem:   Acute respiratory failure with hypoxia (HCC) Active Problems:   MDD (major depressive disorder), recurrent severe, without psychosis (HCC)   SOB (shortness of breath)   Acute bronchitis   Elevated blood pressure reading   Leukocytosis   Obesity (BMI 30-39.9)  Resolved Problems:   * No resolved hospital problems. *  Hospital Course: 23 year old with past medical history significant for depression, ADHD, prediabetes presented to the ED complaining of shortness of breath, cough and wheezing. She reported nonproductive cough. No sick contact. Chest x-ray was unremarkable. White blood cell 13, COVID and flu test negative. Patient was noted to be wheezing, placed on nebulizer and steroids. Patient required 3 L of oxygen on admission.   Assessment and Plan: 1-Acute hypoxic respiratory failure, in the setting of Acute Bronchitis: Suspect related to viral illness. Also she Vapes. Counseling.  - Treated with  IV steroid, DuoNebs, Brovana and Pulmicort, guaifenesin. -Covid, Influenza panel negative.  Respiratory panel pending. - She does have leukocytosis, will cover with antibiotics - off oxygen this am, breathing better, sporadic wheezing, she was able to ambulate on the hall without getting dyspnea.  - Recommend patient to get pulmonary function test as an outpatient to evaluate for late onset asthma. -discharge on albuterol, ipratropium, advair, Augmentin and prednisone.   ADHD: Continue Vyvanse Depression: Continue with Effexor  gabapentin Leukocytosis: In the setting of #1 started on antibiotics   Obesity, BMI 38: Need lifestyle  modification       Consultants: None Procedures performed: none Disposition: Home Diet recommendation:  Discharge Diet Orders (From admission, onward)     Start     Ordered   01/31/24 0000  Diet - low sodium heart healthy        01/31/24 1115           Cardiac diet DISCHARGE MEDICATION: Allergies as of 01/31/2024   No Known Allergies      Medication List     TAKE these medications    albuterol 108 (90 Base) MCG/ACT inhaler Commonly known as: VENTOLIN HFA Inhale 2 puffs into the lungs every 6 (six) hours as needed for wheezing or shortness of breath.   amoxicillin -clavulanate 875-125 MG tablet Commonly known as: AUGMENTIN Take 1 tablet by mouth every 12 (twelve) hours for 4 days.   azithromycin 500 MG tablet Commonly known as: ZITHROMAX Take 1 tablet (500 mg total) by mouth daily for 1 day.   fluticasone-salmeterol 45-21 MCG/ACT inhaler Commonly known as: Advair HFA Inhale 2 puffs into the lungs 2 (two) times daily.   gabapentin 100 MG capsule Commonly known as: NEURONTIN Take 200 mg by mouth 3 (three) times daily.   guaiFENesin 600 MG 12 hr tablet Commonly known as: MUCINEX Take 1 tablet (600 mg total) by mouth 2 (two) times daily.   hydrOXYzine  25 MG capsule Commonly known as: VISTARIL  Take 25 mg by mouth 2 (two) times daily.   ipratropium 17 MCG/ACT inhaler Commonly known as: ATROVENT HFA Inhale 1 puff into the lungs 3 (three) times daily.   L-Methylfolate 7.5 MG Tabs Take 2 tablets by mouth daily.   lisdexamfetamine 60 MG capsule Commonly known as: VYVANSE Take 60  mg by mouth in the morning.   lisdexamfetamine 50 MG capsule Commonly known as: Vyvanse Take 1 capsule (50 mg total) by mouth daily.   Nexplanon 68 MG Impl implant Generic drug: etonogestrel 1 each by Subdermal route once.   predniSONE 10 MG tablet Commonly known as: DELTASONE Take 4 tablets (40 mg total) by mouth daily with breakfast for 5 days.   Rexulti  2 MG Tabs  tablet Generic drug: brexpiprazole  Take 2 mg by mouth every morning.   Spravato (84 MG Dose) 28 MG/DEVICE Sopk Generic drug: Esketamine HCl (84 MG Dose) Place into both nostrils.   venlafaxine  XR 75 MG 24 hr capsule Commonly known as: EFFEXOR -XR Take 225 mg by mouth every morning.   venlafaxine  XR 150 MG 24 hr capsule Commonly known as: EFFEXOR -XR Take 150 mg by mouth daily with breakfast.        Follow-up Information     [facility] Follow up in 1 week(s).   Specialty: Family Medicine Contact information: [address] [place] [address] [place] [address] [phone]                Discharge Exam: Fredricka Weights   01/29/24 2012  Weight: 107.9 kg   General; NAD Lung; sporadic wheezing  Condition at discharge: stable  The results of significant diagnostics from this hospitalization (including imaging, microbiology, ancillary and laboratory) are listed below for reference.   Imaging Studies: DG Chest 2 View Result Date: 01/29/2024 EXAM: 2 VIEW(S) XRAY OF THE CHEST 01/29/2024 11:47:43 AM COMPARISON: Prior studies are available. CLINICAL HISTORY: sob FINDINGS: LUNGS AND PLEURA: No focal pulmonary opacity. No pulmonary edema. No pleural effusion. No pneumothorax. HEART AND MEDIASTINUM: No acute abnormality of the cardiac and mediastinal silhouettes. BONES AND SOFT TISSUES: No acute osseous abnormality. IMPRESSION: 1. No acute cardiopulmonary abnormality. Electronically signed by: Lynwood Seip MD 01/29/2024 12:18 PM EST RP Workstation: [id number]    Microbiology: Results for orders placed or performed during the hospital encounter of 01/29/24  Resp panel by RT-PCR (RSV, Flu A&B, Covid) Anterior Nasal Swab     Status: None   Collection Time: 01/29/24 11:16 AM   Specimen: Anterior Nasal Swab  Result Value Ref Range Status   SARS Coronavirus 2 by RT PCR NEGATIVE NEGATIVE Final    Comment: (NOTE) SARS-CoV-2 target nucleic acids are NOT  DETECTED.  The SARS-CoV-2 RNA is generally detectable in upper respiratory specimens during the acute phase of infection. The lowest concentration of SARS-CoV-2 viral copies this assay can detect is 138 copies/mL. A negative result does not preclude SARS-Cov-2 infection and should not be used as the sole basis for treatment or other patient management decisions. A negative result may occur with  improper specimen collection/handling, submission of specimen other than nasopharyngeal swab, presence of viral mutation(s) within the areas targeted by this assay, and inadequate number of viral copies(<138 copies/mL). A negative result must be combined with clinical observations, patient history, and epidemiological information. The expected result is Negative.  Fact Sheet for Patients:  bloggercourse.com  Fact Sheet for Healthcare Providers:  seriousbroker.it  This test is no t yet approved or cleared by the [place]  FDA and  has been authorized for detection and/or diagnosis of SARS-CoV-2 by FDA under an Emergency Use Authorization (EUA). This EUA will remain  in effect (meaning this test can be used) for the duration of the COVID-19 declaration under Section 564(b)(1) of the Act, 21 U.S.C.section 360bbb-3(b)(1), unless the authorization is terminated  or revoked sooner.  Influenza A by PCR NEGATIVE NEGATIVE Final   Influenza B by PCR NEGATIVE NEGATIVE Final    Comment: (NOTE) The Xpert Xpress SARS-CoV-2/FLU/RSV plus assay is intended as an aid in the diagnosis of influenza from Nasopharyngeal swab specimens and should not be used as a sole basis for treatment. Nasal washings and aspirates are unacceptable for Xpert Xpress SARS-CoV-2/FLU/RSV testing.  Fact Sheet for Patients: bloggercourse.com  Fact Sheet for Healthcare Providers: seriousbroker.it  This test is not yet  approved or cleared by the [place]  FDA and has been authorized for detection and/or diagnosis of SARS-CoV-2 by FDA under an Emergency Use Authorization (EUA). This EUA will remain in effect (meaning this test can be used) for the duration of the COVID-19 declaration under Section 564(b)(1) of the Act, 21 U.S.C. section 360bbb-3(b)(1), unless the authorization is terminated or revoked.     Resp Syncytial Virus by PCR NEGATIVE NEGATIVE Final    Comment: (NOTE) Fact Sheet for Patients: bloggercourse.com  Fact Sheet for Healthcare Providers: seriousbroker.it  This test is not yet approved or cleared by the [place]  FDA and has been authorized for detection and/or diagnosis of SARS-CoV-2 by FDA under an Emergency Use Authorization (EUA). This EUA will remain in effect (meaning this test can be used) for the duration of the COVID-19 declaration under Section 564(b)(1) of the Act, 21 U.S.C. section 360bbb-3(b)(1), unless the authorization is terminated or revoked.  Performed at [facility], [address] [place], [place] [address]   Respiratory (~20 pathogens) panel by PCR     Status: Abnormal   Collection Time: 01/30/24  5:00 AM   Specimen: Nasopharyngeal Swab; Respiratory  Result Value Ref Range Status   Adenovirus NOT DETECTED NOT DETECTED Final   Coronavirus 229E NOT DETECTED NOT DETECTED Final    Comment: (NOTE) The Coronavirus on the Respiratory Panel, DOES NOT test for the novel  Coronavirus (2019 nCoV)    Coronavirus HKU1 NOT DETECTED NOT DETECTED Final   Coronavirus NL63 NOT DETECTED NOT DETECTED Final   Coronavirus OC43 NOT DETECTED NOT DETECTED Final   Metapneumovirus NOT DETECTED NOT DETECTED Final   Rhinovirus / Enterovirus DETECTED (A) NOT DETECTED Final   Influenza A NOT DETECTED NOT DETECTED Final   Influenza B NOT DETECTED NOT DETECTED Final   Parainfluenza Virus 1 NOT DETECTED  NOT DETECTED Final   Parainfluenza Virus 2 NOT DETECTED NOT DETECTED Final   Parainfluenza Virus 3 NOT DETECTED NOT DETECTED Final   Parainfluenza Virus 4 NOT DETECTED NOT DETECTED Final   Respiratory Syncytial Virus NOT DETECTED NOT DETECTED Final   Bordetella pertussis NOT DETECTED NOT DETECTED Final   Bordetella Parapertussis NOT DETECTED NOT DETECTED Final   Chlamydophila pneumoniae NOT DETECTED NOT DETECTED Final   Mycoplasma pneumoniae NOT DETECTED NOT DETECTED Final    Comment: Performed at [facility], [address] [place], [place] [address]    Labs: CBC: Recent Labs  Lab 01/29/24 1152 01/29/24 2047 01/30/24 0500  WBC 13.2* 12.3* 13.8*  NEUTROABS 9.8*  --   --   HGB 14.8 14.0 13.6  HCT 44.0 44.6 41.9  MCV 88.2 91.8 91.7  PLT 306 327 310   Basic Metabolic Panel: Recent Labs  Lab 01/29/24 1152 01/29/24 2047 01/30/24 0500  NA 137  --  137  K 4.0  --  4.0  CL 103  --  105  CO2 20*  --  20*  GLUCOSE 104*  --  105*  BUN 7  --  10  CREATININE 0.79 0.81 0.73  CALCIUM 10.0  --  9.4   Liver Function Tests: No results for input(s): AST, ALT, ALKPHOS, BILITOT, PROT, ALBUMIN in the last 168 hours. CBG: No results for input(s): GLUCAP in the last 168 hours.  Discharge time spent: greater than 30 minutes.  Signed: Owen DELENA Lore, MD [facility] 01/31/2024

## 2024-01-31 NOTE — TOC Progression Note (Addendum)
 Transition of Care [facility]) - Progression Note    Patient Details  Name: Dana Thompson MRN: [id number] Date of Birth: October 06, 2000  [facility] of [facility] CM/SW Contact  Doneta Glenys DASEN, RN Phone Number: 01/31/2024, 9:06 AM  Clinical Narrative:    Per chart review, patient is no long requiring oxygen. IP CM  not able to identify any additional needs. Place consult if needs present. 9:48 AM Patient's nurse chatted me,that patient's mother is requesting to speak with someone in billing that can assist with how they can pay for this hospital stay. CM sent email to financial navigator Zelda  to call patient's mother Dana Thompson [phone].  Expected Discharge Plan: Home/Self Care Barriers to Discharge: No Barriers Identified               Expected Discharge Plan and Services In-house Referral: NA Discharge Planning Services: CM Consult Post Acute Care Choice: NA Living arrangements for the past 2 months: Single Family Home                 DME Arranged: N/A DME Agency: NA       HH Arranged: NA HH Agency: NA         Social Drivers of Health (SDOH) Interventions SDOH Screenings   Food Insecurity: No Food Insecurity (01/29/2024)  Housing: Low Risk  (01/29/2024)  Transportation Needs: No Transportation Needs (01/29/2024)  Utilities: Not At Risk (01/29/2024)  Alcohol Screen: Low Risk  (04/29/2017)  Social Connections: Moderately Isolated (01/29/2024)  Tobacco Use: Low Risk  (01/29/2024)    Readmission Risk Interventions     No data to display

## 2024-01-31 NOTE — Discharge Instructions (Signed)
 Use Albuterol and Ipatropium  Inhale every 6 hours for next 3 days then use as needed for wheezing and Shortness of breath.   Use Advair inhale twice a day Take prednisone 40mg  daily for 5 day.  Take antibiotics Augmentin for 5 days.

## 2024-03-02 ENCOUNTER — Other Ambulatory Visit (HOSPITAL_BASED_OUTPATIENT_CLINIC_OR_DEPARTMENT_OTHER): Payer: Self-pay

## 2024-03-02 MED ORDER — VENLAFAXINE HCL ER 75 MG PO CP24
225.0000 mg | ORAL_CAPSULE | Freq: Every morning | ORAL | 0 refills | Status: AC
  Filled 2024-03-02: qty 90, 30d supply, fill #0

## 2024-03-02 MED ORDER — GABAPENTIN 100 MG PO CAPS
200.0000 mg | ORAL_CAPSULE | Freq: Three times a day (TID) | ORAL | 2 refills | Status: AC
  Filled 2024-03-02: qty 180, 30d supply, fill #0

## 2024-03-02 MED ORDER — REXULTI 2 MG PO TABS
2.0000 mg | ORAL_TABLET | Freq: Every morning | ORAL | 0 refills | Status: AC
  Filled 2024-03-02: qty 30, 30d supply, fill #0

## 2024-03-03 ENCOUNTER — Other Ambulatory Visit (HOSPITAL_BASED_OUTPATIENT_CLINIC_OR_DEPARTMENT_OTHER): Payer: Self-pay

## 2024-03-04 ENCOUNTER — Other Ambulatory Visit (HOSPITAL_BASED_OUTPATIENT_CLINIC_OR_DEPARTMENT_OTHER): Payer: Self-pay

## 2024-03-04 MED ORDER — LISDEXAMFETAMINE DIMESYLATE 50 MG PO CAPS
50.0000 mg | ORAL_CAPSULE | Freq: Every day | ORAL | 0 refills | Status: DC
  Filled 2024-03-04: qty 30, 30d supply, fill #0

## 2024-03-04 MED ORDER — L-METHYLFOLATE 15 MG PO TABS
15.0000 mg | ORAL_TABLET | Freq: Every day | ORAL | 1 refills | Status: AC
  Filled 2024-03-04: qty 30, 30d supply, fill #0

## 2024-03-05 ENCOUNTER — Other Ambulatory Visit (HOSPITAL_BASED_OUTPATIENT_CLINIC_OR_DEPARTMENT_OTHER): Payer: Self-pay

## 2024-04-14 ENCOUNTER — Ambulatory Visit: Admitting: Allergy & Immunology

## 2024-04-22 ENCOUNTER — Other Ambulatory Visit (HOSPITAL_BASED_OUTPATIENT_CLINIC_OR_DEPARTMENT_OTHER): Payer: Self-pay

## 2024-04-22 MED ORDER — L-METHYLFOLATE 15 MG PO TABS
1.0000 | ORAL_TABLET | Freq: Every day | ORAL | 1 refills | Status: AC
  Filled 2024-04-22: qty 30, 30d supply, fill #0

## 2024-04-22 MED ORDER — LISDEXAMFETAMINE DIMESYLATE 50 MG PO CAPS
50.0000 mg | ORAL_CAPSULE | Freq: Every day | ORAL | 0 refills | Status: AC
  Filled 2024-04-22: qty 30, 30d supply, fill #0

## 2024-04-23 ENCOUNTER — Other Ambulatory Visit (HOSPITAL_BASED_OUTPATIENT_CLINIC_OR_DEPARTMENT_OTHER): Payer: Self-pay

## 2024-04-23 ENCOUNTER — Other Ambulatory Visit: Payer: Self-pay

## 2024-04-23 MED ORDER — REXULTI 2 MG PO TABS
2.0000 mg | ORAL_TABLET | Freq: Every morning | ORAL | 0 refills | Status: AC
  Filled 2024-04-23: qty 30, 30d supply, fill #0

## 2024-04-24 ENCOUNTER — Other Ambulatory Visit (HOSPITAL_BASED_OUTPATIENT_CLINIC_OR_DEPARTMENT_OTHER): Payer: Self-pay

## 2024-12-23 ENCOUNTER — Ambulatory Visit: Admitting: Physician Assistant
